# Patient Record
Sex: Female | Born: 1995 | Race: Black or African American | Hispanic: No | Marital: Single | Smoking: Current every day smoker
Health system: Southern US, Community
[De-identification: ages and names within clinical notes are randomized; demographics above are authoritative.]

## PROBLEM LIST (undated history)

## (undated) DIAGNOSIS — N946 Dysmenorrhea, unspecified: Secondary | ICD-10-CM

---
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
## 2015-01-08 ENCOUNTER — Emergency Department (HOSPITAL_COMMUNITY): Payer: Managed Care, Other (non HMO)

## 2015-01-08 ENCOUNTER — Encounter (HOSPITAL_COMMUNITY): Payer: Self-pay | Admitting: *Deleted

## 2015-01-08 ENCOUNTER — Emergency Department (HOSPITAL_COMMUNITY)
Admission: EM | Admit: 2015-01-08 | Discharge: 2015-01-08 | Disposition: A | Discharge status: Home / Self Care | Payer: Managed Care, Other (non HMO) | Attending: Emergency Medicine | Admitting: Emergency Medicine

## 2015-01-08 DIAGNOSIS — Z8742 Personal history of other diseases of the female genital tract: Secondary | ICD-10-CM | POA: Diagnosis not present

## 2015-01-08 DIAGNOSIS — R55 Syncope and collapse: Secondary | ICD-10-CM | POA: Diagnosis present

## 2015-01-08 DIAGNOSIS — Z3202 Encounter for pregnancy test, result negative: Secondary | ICD-10-CM | POA: Insufficient documentation

## 2015-01-08 LAB — CBC WITH DIFFERENTIAL/PLATELET
Basophils Absolute: 0 10*3/uL (ref 0.0–0.1)
Basophils Relative: 1 % (ref 0–1)
EOS ABS: 0.4 10*3/uL (ref 0.0–0.7)
EOS PCT: 7 % — AB (ref 0–5)
HCT: 39.7 % (ref 36.0–46.0)
Hemoglobin: 12.7 g/dL (ref 12.0–15.0)
LYMPHS PCT: 40 % (ref 12–46)
Lymphs Abs: 1.9 10*3/uL (ref 0.7–4.0)
MCH: 26.3 pg (ref 26.0–34.0)
MCHC: 32 g/dL (ref 30.0–36.0)
MCV: 82.4 fL (ref 78.0–100.0)
MONO ABS: 0.4 10*3/uL (ref 0.1–1.0)
Monocytes Relative: 9 % (ref 3–12)
Neutro Abs: 2.1 10*3/uL (ref 1.7–7.7)
Neutrophils Relative %: 43 % (ref 43–77)
PLATELETS: 260 10*3/uL (ref 150–400)
RBC: 4.82 MIL/uL (ref 3.87–5.11)
RDW: 13 % (ref 11.5–15.5)
WBC: 4.9 10*3/uL (ref 4.0–10.5)

## 2015-01-08 LAB — URINALYSIS, ROUTINE W REFLEX MICROSCOPIC
Bilirubin Urine: NEGATIVE
GLUCOSE, UA: NEGATIVE mg/dL
HGB URINE DIPSTICK: NEGATIVE
Ketones, ur: NEGATIVE mg/dL
LEUKOCYTES UA: NEGATIVE
Nitrite: NEGATIVE
PH: 6 (ref 5.0–8.0)
PROTEIN: 30 mg/dL — AB
SPECIFIC GRAVITY, URINE: 1.045 — AB (ref 1.005–1.030)
Urobilinogen, UA: 1 mg/dL (ref 0.0–1.0)

## 2015-01-08 LAB — URINE MICROSCOPIC-ADD ON

## 2015-01-08 LAB — COMPREHENSIVE METABOLIC PANEL
ALT: 11 U/L — ABNORMAL LOW (ref 14–54)
ANION GAP: 8 (ref 5–15)
AST: 19 U/L (ref 15–41)
Albumin: 4.1 g/dL (ref 3.5–5.0)
Alkaline Phosphatase: 48 U/L (ref 38–126)
BUN: 16 mg/dL (ref 6–20)
CHLORIDE: 108 mmol/L (ref 101–111)
CO2: 24 mmol/L (ref 22–32)
CREATININE: 1.06 mg/dL — AB (ref 0.44–1.00)
Calcium: 8.9 mg/dL (ref 8.9–10.3)
GFR calc Af Amer: >60 mL/min (ref >60)
Glucose, Bld: 88 mg/dL (ref 65–99)
POTASSIUM: 4.3 mmol/L (ref 3.5–5.1)
SODIUM: 140 mmol/L (ref 135–145)
Total Bilirubin: 0.3 mg/dL (ref 0.3–1.2)
Total Protein: 7.1 g/dL (ref 6.5–8.1)

## 2015-01-08 LAB — PREGNANCY, URINE: Preg Test, Ur: NEGATIVE

## 2015-01-08 MED ORDER — SODIUM CHLORIDE 0.9 % IV BOLUS (SEPSIS)
1000.0000 mL | Freq: Once | INTRAVENOUS | Status: AC
  Administered 2015-01-08: 1000 mL via INTRAVENOUS

## 2015-01-08 NOTE — Discharge Instructions (Signed)
Drink plenty of fluids.  Follow up with a family md if any more problems.  Eat something 3 times a day

## 2015-01-08 NOTE — ED Notes (Addendum)
Syncopal event. Noticed blurred vision just prior to passing out. Did strike right forehead, aching pain now. Mild headache. Denies SOB or CP. Vision back to normal.  Passed out Thursday while changing the oil in her car. Vomited a few times, then passed out. Similar episodes 2 years ago as well.

## 2015-01-08 NOTE — ED Provider Notes (Addendum)
CSN: [id number]     Arrival date & time 01/08/15  1515 History   First MD Initiated Contact with Patient 01/08/15 1520     Chief Complaint  Patient presents with  . Loss of Consciousness     (Consider location/radiation/quality/duration/timing/severity/associated sxs/prior Treatment) Patient is a 19 y.o. female presenting with syncope. The history is provided by the patient (The patient states that she was standing outside and felt dizzy and then passed out. Patient states that she had not eaten anything all day.).  Loss of Consciousness Episode history:  Single Most recent episode:  Today Timing:  Rare Chronicity:  New Context: not blood draw   Witnessed: no   Relieved by:  Drinking Ineffective treatments:  None tried Associated symptoms: no chest pain, no headaches and no seizures     Past Medical History  Diagnosis Date  . Menstrual cramps    History reviewed. No pertinent past surgical history. History reviewed. No pertinent family history. Social History  Substance Use Topics  . Smoking status: Never Smoker   . Smokeless tobacco: None  . Alcohol Use: No   OB History    No data available     Review of Systems  Constitutional: Negative for appetite change and fatigue.  HENT: Negative for congestion, ear discharge and sinus pressure.   Eyes: Negative for discharge.  Respiratory: Negative for cough.   Cardiovascular: Positive for syncope. Negative for chest pain.  Gastrointestinal: Negative for abdominal pain and diarrhea.  Genitourinary: Negative for frequency and hematuria.  Musculoskeletal: Negative for back pain.  Skin: Negative for rash.  Neurological: Positive for syncope. Negative for seizures and headaches.  Psychiatric/Behavioral: Negative for hallucinations.      Allergies  Pineapple  Home Medications   Prior to Admission medications   Medication Sig Start Date End Date Taking? 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 Patient not taking: Reported on 12/27/2014 11/09/14   Rolland Porter, MD  ibuprofen (ADVIL,MOTRIN) 600 MG tablet Take 1 tablet (600 mg total) by mouth every 6 (six) hours as needed. Patient not taking: Reported on 01/08/2015 12/27/14   Antony Madura, PA-C   BP 123/77 mmHg  Pulse 74  Temp(Src) 98.9 F (37.2 C) (Oral)  Resp 20  SpO2 100%  LMP 12/14/2014 Physical Exam  Constitutional: She is oriented to person, place, and time. She appears well-developed.  HENT:  Head: Normocephalic.  Eyes: Conjunctivae and EOM are normal. No scleral icterus.  Neck: Neck supple. No thyromegaly present.  Cardiovascular: Normal rate and regular rhythm.  Exam reveals no gallop and no friction rub.   No murmur heard. Pulmonary/Chest: No stridor. She has no wheezes. She has no rales. She exhibits no tenderness.  Abdominal: She exhibits no distension. There is no tenderness. There is no rebound.  Musculoskeletal: Normal range of motion. She exhibits no edema.  Lymphadenopathy:    She has no cervical adenopathy.  Neurological: She is oriented to person, place, and time. She exhibits normal muscle tone. Coordination normal.  Skin: No rash noted. No erythema.  Psychiatric: She has a normal mood and affect. Her behavior is normal.    ED Course  Procedures (including critical care time) Labs Review Labs Reviewed  CBC WITH DIFFERENTIAL/PLATELET - Abnormal; Notable for the following:    Eosinophils Relative 7 (*)    All other components within normal limits  COMPREHENSIVE METABOLIC PANEL - Abnormal; Notable  for the following:    Creatinine, Ser 1.06 (*)    ALT 11 (*)    All other components within normal limits  URINALYSIS, ROUTINE W REFLEX MICROSCOPIC (NOT AT [facility]) - Abnormal; Notable for the following:    APPearance CLOUDY (*)    Specific Gravity, Urine 1.045 (*)    Protein, ur 30 (*)    All other components  within normal limits  PREGNANCY, URINE  URINE MICROSCOPIC-ADD ON    Imaging Review Ct Head Wo Contrast  01/08/2015   CLINICAL DATA:  Blurred vision prior to passing out. Aching pain alum  EXAM: CT HEAD WITHOUT CONTRAST  TECHNIQUE: Contiguous axial images were obtained from the base of the skull through the vertex without intravenous contrast.  COMPARISON:  None.  FINDINGS: There is no evidence of mass effect, midline shift or extra-axial fluid collections. There is no evidence of a space-occupying lesion or intracranial hemorrhage. There is no evidence of a cortical-based area of acute infarction.  The ventricles and sulci are appropriate for the patient's age. The basal cisterns are patent.  Visualized portions of the orbits are unremarkable. The visualized portions of the paranasal sinuses and mastoid air cells are unremarkable.  The osseous structures are unremarkable.  IMPRESSION: Normal CT of the brain without intravenous contrast.   Electronically Signed   By: Elige Ko   On: 01/08/2015 16:12   I have personally reviewed and evaluated these images and lab results as part of my medical decision-making.   EKG Interpretation   Date/Time:  Sunday January 08 2015 15:21:36 EDT Ventricular Rate:  78 PR Interval:  151 QRS Duration: 71 QT Interval:  362 QTC Calculation: 412 R Axis:   90 Text Interpretation:  Sinus rhythm Borderline right axis deviation  Confirmed by Morgyn Marut  MD, Jentzen Minasyan ([id number]) on 01/08/2015 5:23:51 PM      MDM   Final diagnoses:  Syncope and collapse    Labs EKG CT of head follow unremarkable. Patient given 1 L of fluids. Syncope most likely secondary to not eating. Patient was instructed to eat at least something 3 times a day and follow-up with her doctor as needed.    Bethann Berkshire, MD 01/08/15 1725  Bethann Berkshire, MD 01/24/15 [phone]

## 2015-05-31 ENCOUNTER — Emergency Department (HOSPITAL_BASED_OUTPATIENT_CLINIC_OR_DEPARTMENT_OTHER)
Admission: EM | Admit: 2015-05-31 | Discharge: 2015-05-31 | Disposition: A | Discharge status: Home / Self Care | Payer: Managed Care, Other (non HMO) | Attending: Emergency Medicine | Admitting: Emergency Medicine

## 2015-05-31 ENCOUNTER — Encounter (HOSPITAL_BASED_OUTPATIENT_CLINIC_OR_DEPARTMENT_OTHER): Payer: Self-pay

## 2015-05-31 DIAGNOSIS — Z8742 Personal history of other diseases of the female genital tract: Secondary | ICD-10-CM | POA: Diagnosis not present

## 2015-05-31 DIAGNOSIS — K0889 Other specified disorders of teeth and supporting structures: Secondary | ICD-10-CM | POA: Diagnosis present

## 2015-05-31 MED ORDER — OXYCODONE-ACETAMINOPHEN 5-325 MG PO TABS: ORAL_TABLET | ORAL | Status: DC

## 2015-05-31 MED ORDER — AMOXICILLIN 500 MG PO CAPS
1000.0000 mg | ORAL_CAPSULE | Freq: Once | ORAL | Status: AC
  Administered 2015-05-31: 1000 mg via ORAL
  Filled 2015-05-31: qty 2

## 2015-05-31 MED ORDER — AMOXICILLIN 500 MG PO CAPS: 500.0000 mg | ORAL_CAPSULE | Freq: Three times a day (TID) | ORAL | Status: DC

## 2015-05-31 MED ORDER — OXYCODONE-ACETAMINOPHEN 5-325 MG PO TABS
1.0000 | ORAL_TABLET | Freq: Once | ORAL | Status: AC
  Administered 2015-05-31: 1 via ORAL
  Filled 2015-05-31: qty 1

## 2015-05-31 MED ORDER — ONDANSETRON 4 MG PO TBDP
4.0000 mg | ORAL_TABLET | Freq: Once | ORAL | Status: AC
  Administered 2015-05-31: 4 mg via ORAL
  Filled 2015-05-31: qty 1

## 2015-05-31 NOTE — ED Provider Notes (Signed)
CSN: [id number]     Arrival date & time 05/31/15  2047 History  By signing my name below, I, Tamara Cruz, attest that this documentation has been prepared under the direction and in the presence of [employer], PA-C. Electronically Signed: Linus Cruz, ED Scribe. 05/31/2015. 10:32 PM.   Chief Complaint  Patient presents with  . Dental Pain   HPI HPI Comments: Tamara Cruz is a 20 y.o. female with no PMHx who presents to the Emergency Department complaining of constant, unchanged top left dental pain that began last night. Pt reports pain is exacerbated with palpation but denies any alleviating factors. Pt took Tylenol with mild relief. Pt denies any fevers, chills, difficulty swallowing, or any other sx.   Past Medical History  Diagnosis Date  . Menstrual cramps    No past surgical history on file. No family history on file. Social History  Substance Use Topics  . Smoking status: Never Smoker   . Smokeless tobacco: None  . Alcohol Use: No   OB History    No data available     Review of Systems   A complete 10 system review of systems was obtained and all systems are negative except as noted in the HPI and PMH.   Allergies  Pineapple  Home Medications   Prior to Admission medications   Not on File   BP 137/86 mmHg  Pulse 82  Temp(Src) 98.3 F (36.8 C) (Oral)  Resp 18  Ht  (1.549 m)  Wt 118 lb (53.524 kg)  BMI 22.31 kg/m2  SpO2 100%  LMP 05/19/2015 Physical Exam  Constitutional: She is oriented to person, place, and time. She appears well-developed and well-nourished. No distress.  HENT:  Head: Normocephalic.  Mouth/Throat: Uvula is midline and oropharynx is clear and moist. No trismus in the jaw. No uvula swelling. No oropharyngeal exudate, posterior oropharyngeal edema, posterior oropharyngeal erythema or tonsillar abscesses.  Generally poor dentition, no gingival swelling, erythema or tenderness to palpation. Patient is handling their  secretions. There is no tenderness to palpation or firmness underneath tongue bilaterally. No trismus.    Eyes: Conjunctivae and EOM are normal.  Cardiovascular: Normal rate.   Pulmonary/Chest: Effort normal. No stridor.  Musculoskeletal: Normal range of motion.  Lymphadenopathy:    She has no cervical adenopathy.  Neurological: She is alert and oriented to person, place, and time.  Psychiatric: She has a normal mood and affect.  Nursing note and vitals reviewed.   ED Course  Procedures  DIAGNOSTIC STUDIES: Oxygen Saturation is 100% on room air, normal by my interpretation.    COORDINATION OF CARE: 10:43 PM WIll give Amoxicillin, Oxycodone and Zofran. Discussed treatment Cruz with pt at bedside and pt agreed to Cruz.   Labs Review Labs Reviewed - No data to display  Imaging Review No results found. I have personally reviewed and evaluated these images and lab results as part of my medical decision-making.   EKG Interpretation None      MDM   Final diagnoses:  Pain, dental    Filed Vitals:   05/31/15 2053  BP: 137/86  Pulse: 82  Temp: 98.3 F (36.8 C)  TempSrc: Oral  Resp: 18  Height:  (1.549 m)  Weight: 53.524 kg  SpO2: 100%    Medications  amoxicillin (AMOXIL) capsule 1,000 mg (not administered)  oxyCODONE-acetaminophen (PERCOCET/ROXICET) 5-325 MG per tablet 1 tablet (not administered)  ondansetron (ZOFRAN-ODT) disintegrating tablet 4 mg (not administered)    Tamara Cruz is 20 y.o.  female presenting with dental pain, slight swelling along the long, possible early dental abscess, no systemic signs of infection. Patient does have a outpatient dentist.    Evaluation does not show pathology that would require ongoing emergent intervention or inpatient treatment. Pt is hemodynamically stable and mentating appropriately. Discussed findings and Cruz with patient/guardian, who agrees with care Cruz. All questions answered. Return precautions discussed and  outpatient follow up given.   New Prescriptions   AMOXICILLIN (AMOXIL) 500 MG CAPSULE    Take 1 capsule (500 mg total) by mouth 3 (three) times daily.   OXYCODONE-ACETAMINOPHEN (PERCOCET/ROXICET) 5-325 MG TABLET    1 to 2 tabs PO q6hrs  PRN for pain     I personally performed the services described in this documentation, which was scribed in my presence. The recorded information has been reviewed and is accurate.     Tamara Emery, PA-C 05/31/15 2254  Tamara Plan, DO 06/01/15 1142

## 2015-05-31 NOTE — Discharge Instructions (Signed)
Take percocet for breakthrough pain, do not drink alcohol, drive, care for children or do other critical tasks while taking percocet.  Return to the emergency room for fever, change in vision, redness to the face that rapidly spreads towards the eye, nausea or vomiting, difficulty swallowing or shortness of breath.   Apply warm compresses to jaw throughout the day.   Take your antibiotics as directed and to the end of the course.   Followup with a dentist is very important for ongoing evaluation and management of recurrent dental pain. Return to emergency department for emergent changing or worsening symptoms.

## 2015-05-31 NOTE — ED Notes (Signed)
Left upper toothache since last night-tylenol,oragel with no relief

## 2015-07-18 ENCOUNTER — Emergency Department (HOSPITAL_BASED_OUTPATIENT_CLINIC_OR_DEPARTMENT_OTHER)
Admission: EM | Admit: 2015-07-18 | Discharge: 2015-07-18 | Disposition: A | Discharge status: Home / Self Care | Payer: Managed Care, Other (non HMO) | Attending: Emergency Medicine | Admitting: Emergency Medicine

## 2015-07-18 ENCOUNTER — Emergency Department (HOSPITAL_BASED_OUTPATIENT_CLINIC_OR_DEPARTMENT_OTHER): Payer: Managed Care, Other (non HMO)

## 2015-07-18 ENCOUNTER — Encounter (HOSPITAL_BASED_OUTPATIENT_CLINIC_OR_DEPARTMENT_OTHER): Payer: Self-pay | Admitting: *Deleted

## 2015-07-18 DIAGNOSIS — Z792 Long term (current) use of antibiotics: Secondary | ICD-10-CM | POA: Diagnosis not present

## 2015-07-18 DIAGNOSIS — J111 Influenza due to unidentified influenza virus with other respiratory manifestations: Secondary | ICD-10-CM | POA: Diagnosis not present

## 2015-07-18 DIAGNOSIS — J9801 Acute bronchospasm: Secondary | ICD-10-CM | POA: Insufficient documentation

## 2015-07-18 DIAGNOSIS — Z8742 Personal history of other diseases of the female genital tract: Secondary | ICD-10-CM | POA: Insufficient documentation

## 2015-07-18 DIAGNOSIS — R05 Cough: Secondary | ICD-10-CM | POA: Diagnosis present

## 2015-07-18 DIAGNOSIS — R000 Tachycardia, unspecified: Secondary | ICD-10-CM | POA: Diagnosis not present

## 2015-07-18 MED ORDER — ACETAMINOPHEN 500 MG PO TABS
1000.0000 mg | ORAL_TABLET | Freq: Once | ORAL | Status: AC
  Administered 2015-07-18: 1000 mg via ORAL
  Filled 2015-07-18: qty 2

## 2015-07-18 MED ORDER — ALBUTEROL SULFATE (2.5 MG/3ML) 0.083% IN NEBU
2.5000 mg | INHALATION_SOLUTION | Freq: Once | RESPIRATORY_TRACT | Status: AC
  Administered 2015-07-18: 2.5 mg via RESPIRATORY_TRACT
  Filled 2015-07-18: qty 3

## 2015-07-18 MED ORDER — GUAIFENESIN-CODEINE 100-10 MG/5ML PO SYRP: 5.0000 mL | ORAL_SOLUTION | Freq: Three times a day (TID) | ORAL | Status: DC | PRN

## 2015-07-18 MED ORDER — HYDROCODONE-ACETAMINOPHEN 7.5-325 MG/15ML PO SOLN
10.0000 mL | Freq: Once | ORAL | Status: AC
  Administered 2015-07-18: 10 mL via ORAL
  Filled 2015-07-18: qty 15

## 2015-07-18 MED ORDER — ALBUTEROL SULFATE HFA 108 (90 BASE) MCG/ACT IN AERS: 1.0000 | INHALATION_SPRAY | Freq: Four times a day (QID) | RESPIRATORY_TRACT | Status: DC | PRN

## 2015-07-18 MED ORDER — PREDNISONE 10 MG PO TABS
60.0000 mg | ORAL_TABLET | Freq: Once | ORAL | Status: AC
  Administered 2015-07-18: 60 mg via ORAL
  Filled 2015-07-18: qty 1

## 2015-07-18 MED ORDER — PREDNISONE 20 MG PO TABS: 20.0000 mg | ORAL_TABLET | Freq: Two times a day (BID) | ORAL | Status: DC

## 2015-07-18 NOTE — ED Notes (Signed)
Cough for a week. Fever last week.

## 2015-07-18 NOTE — ED Provider Notes (Signed)
CSN: [id number]     Arrival date & time 07/18/15  1701 History   First MD Initiated Contact with Patient 07/18/15 1719     Chief Complaint  Patient presents with  . Cough      HPI  Patient presents for evaluation of cough and fever. Cough for 2 days last week. Had fever for 2 days. Cough persists. States she coughs until she "almost gags or vomits". No nausea. Had diffuse bodyaches last week. Not now. No vomiting diarrhea.  Was not immunized for influenza.  Past Medical History  Diagnosis Date  . Menstrual cramps    History reviewed. No pertinent past surgical history. No family history on file. Social History  Substance Use Topics  . Smoking status: Never Smoker   . Smokeless tobacco: None  . Alcohol Use: No   OB History    No data available     Review of Systems  Constitutional: Positive for fever. Negative for chills, diaphoresis, appetite change and fatigue.  HENT: Negative for mouth sores, sore throat and trouble swallowing.   Eyes: Negative for visual disturbance.  Respiratory: Positive for cough, chest tightness, shortness of breath and wheezing.   Cardiovascular: Negative for chest pain.  Gastrointestinal: Negative for nausea, vomiting, abdominal pain, diarrhea and abdominal distention.  Endocrine: Negative for polydipsia, polyphagia and polyuria.  Genitourinary: Negative for dysuria, frequency and hematuria.  Musculoskeletal: Negative for gait problem.  Skin: Negative for color change, pallor and rash.  Neurological: Negative for dizziness, syncope, light-headedness and headaches.  Hematological: Does not bruise/bleed easily.  Psychiatric/Behavioral: Negative for behavioral problems and confusion.      Allergies  Pineapple  Home Medications   Prior to Admission medications   Medication Sig Start Date End Date Taking? Authorizing Provider  albuterol (PROVENTIL HFA;VENTOLIN HFA) 108 (90 Base) MCG/ACT inhaler Inhale 1-2 puffs into the lungs every 6 (six)  hours as needed for wheezing. 07/18/15   Rolland Porter, MD  amoxicillin (AMOXIL) 500 MG capsule Take 1 capsule (500 mg total) by mouth 3 (three) times daily. 05/31/15   Nicole Pisciotta, PA-C  guaiFENesin-codeine (CHERATUSSIN AC) 100-10 MG/5ML syrup Take 5 mLs by mouth 3 (three) times daily as needed for cough. 07/18/15   Rolland Porter, MD  oxyCODONE-acetaminophen (PERCOCET/ROXICET) 5-325 MG tablet 1 to 2 tabs PO q6hrs  PRN for pain 05/31/15   Joni Reining Pisciotta, PA-C  predniSONE (DELTASONE) 20 MG tablet Take 1 tablet (20 mg total) by mouth 2 (two) times daily with a meal. 07/18/15   Rolland Porter, MD   BP 107/66 mmHg  Temp(Src) 99.1 F (37.3 C) (Oral)  Resp 20  Ht 5' (1.524 m)  Wt 122 lb (55.339 kg)  BMI 23.83 kg/m2  SpO2 100%  LMP 07/11/2015 Physical Exam  Constitutional: She is oriented to person, place, and time. She appears well-developed and well-nourished. No distress.  HENT:  Head: Normocephalic.  Eyes: Conjunctivae are normal. Pupils are equal, round, and reactive to light. No scleral icterus.  Neck: Normal range of motion. Neck supple. No thyromegaly present.  Cardiovascular: Regular rhythm.  Tachycardia present.  Exam reveals no gallop and no friction rub.   No murmur heard. Pulmonary/Chest: Effort normal and breath sounds normal. No respiratory distress. She has no wheezes. She has no rales.  Wheezing prolongation in all fields. No focal signs of consolidation.  Abdominal: Soft. Bowel sounds are normal. She exhibits no distension. There is no tenderness. There is no rebound.  Musculoskeletal: Normal range of motion.  Neurological: She is alert and  oriented to person, place, and time.  Skin: Skin is warm and dry. No rash noted.  Psychiatric: She has a normal mood and affect. Her behavior is normal.    ED Course  Procedures (including critical care time) Labs Review Labs Reviewed - No data to display  Imaging Review Dg Chest 2 View  07/18/2015  CLINICAL DATA:  Acute onset of sore  throat, fever, cough and generalized chest tightness. Initial encounter. EXAM: CHEST  2 VIEW COMPARISON:  None. FINDINGS: The lungs are well-aerated and clear. There is no evidence of focal opacification, pleural effusion or pneumothorax. The heart is normal in size; the mediastinal contour is within normal limits. No acute osseous abnormalities are seen. Bilateral nipple piercings are noted. IMPRESSION: No acute cardiopulmonary process seen. Electronically Signed   By: Roanna RaiderJeffery  Chang M.D.   On: 07/18/2015 18:28   I have personally reviewed and evaluated these images and lab results as part of my medical decision-making.   EKG Interpretation None      MDM   Final diagnoses:  Influenza  Bronchospasm    My initial evaluation patient's heart rate 120. Temp 100.9. Given Tylenol. Chest x-ray obtained. Given nebulized albuterol and by mouth prednisone. On recheck her lungs are improved with mild prolongation and spasm with cough. No wheezing at rest. worker breathing. Heart rate was 18. Temperature improved 99.1. Plan is home, prednisone, Cheratussin, albuterol MDI. Probable influenza.   Rolland PorterMark Keyshawna Prouse, MD 07/18/15 1850

## 2015-07-18 NOTE — Discharge Instructions (Signed)

## 2015-10-11 ENCOUNTER — Encounter (HOSPITAL_BASED_OUTPATIENT_CLINIC_OR_DEPARTMENT_OTHER): Payer: Self-pay | Admitting: *Deleted

## 2015-10-11 DIAGNOSIS — N390 Urinary tract infection, site not specified: Secondary | ICD-10-CM | POA: Insufficient documentation

## 2015-10-11 DIAGNOSIS — M545 Low back pain: Secondary | ICD-10-CM | POA: Diagnosis present

## 2015-10-11 DIAGNOSIS — A5901 Trichomonal vulvovaginitis: Secondary | ICD-10-CM | POA: Diagnosis not present

## 2015-10-11 NOTE — ED Notes (Signed)
Pt c/o lower back pain x 8 hrs , denies urinary symptoms

## 2015-10-12 ENCOUNTER — Telehealth (HOSPITAL_BASED_OUTPATIENT_CLINIC_OR_DEPARTMENT_OTHER): Payer: Self-pay | Admitting: Emergency Medicine

## 2015-10-12 ENCOUNTER — Emergency Department (HOSPITAL_BASED_OUTPATIENT_CLINIC_OR_DEPARTMENT_OTHER)
Admission: EM | Admit: 2015-10-12 | Discharge: 2015-10-12 | Disposition: A | Discharge status: Home / Self Care | Payer: Managed Care, Other (non HMO) | Attending: Emergency Medicine | Admitting: Emergency Medicine

## 2015-10-12 DIAGNOSIS — A5901 Trichomonal vulvovaginitis: Secondary | ICD-10-CM

## 2015-10-12 DIAGNOSIS — N390 Urinary tract infection, site not specified: Secondary | ICD-10-CM

## 2015-10-12 LAB — URINALYSIS, ROUTINE W REFLEX MICROSCOPIC
BILIRUBIN URINE: NEGATIVE
GLUCOSE, UA: NEGATIVE mg/dL
KETONES UR: NEGATIVE mg/dL
Nitrite: POSITIVE — AB
PH: 7 (ref 5.0–8.0)
PROTEIN: NEGATIVE mg/dL
Specific Gravity, Urine: 1.021 (ref 1.005–1.030)

## 2015-10-12 LAB — WET PREP, GENITAL
Sperm: NONE SEEN
Yeast Wet Prep HPF POC: NONE SEEN

## 2015-10-12 LAB — URINE MICROSCOPIC-ADD ON

## 2015-10-12 LAB — GC/CHLAMYDIA PROBE AMP (~~LOC~~) NOT AT ARMC
CHLAMYDIA, DNA PROBE: NEGATIVE
NEISSERIA GONORRHEA: NEGATIVE

## 2015-10-12 LAB — PREGNANCY, URINE: Preg Test, Ur: NEGATIVE

## 2015-10-12 MED ORDER — METRONIDAZOLE 500 MG PO TABS
2000.0000 mg | ORAL_TABLET | Freq: Once | ORAL | Status: AC
  Administered 2015-10-12: 2000 mg via ORAL
  Filled 2015-10-12: qty 4

## 2015-10-12 MED ORDER — NAPROXEN 250 MG PO TABS
500.0000 mg | ORAL_TABLET | Freq: Once | ORAL | Status: AC
  Administered 2015-10-12: 500 mg via ORAL
  Filled 2015-10-12: qty 2

## 2015-10-12 MED ORDER — HYDROCODONE-ACETAMINOPHEN 5-325 MG PO TABS
1.0000 | ORAL_TABLET | Freq: Once | ORAL | Status: AC
  Administered 2015-10-12: 1 via ORAL
  Filled 2015-10-12: qty 1

## 2015-10-12 MED ORDER — HYDROCODONE-ACETAMINOPHEN 5-325 MG PO TABS: 2.0000 | ORAL_TABLET | Freq: Four times a day (QID) | ORAL | Status: DC | PRN

## 2015-10-12 MED ORDER — SULFAMETHOXAZOLE-TRIMETHOPRIM 800-160 MG PO TABS: 1.0000 | ORAL_TABLET | Freq: Two times a day (BID) | ORAL | Status: AC

## 2015-10-12 MED ORDER — SULFAMETHOXAZOLE-TRIMETHOPRIM 800-160 MG PO TABS
1.0000 | ORAL_TABLET | Freq: Once | ORAL | Status: AC
  Administered 2015-10-12: 1 via ORAL
  Filled 2015-10-12: qty 1

## 2015-10-12 NOTE — ED Notes (Signed)
Pt c/o middle lower back pain that started this afternoon.  She denies known injury, denies urinary symptoms, denies numbness in groin, denies incontinence, pain worse with movement.  Pt took 1000mg  tylenol without relief.

## 2015-10-12 NOTE — ED Notes (Signed)
Pt verbalizes understanding of d/c instructions and denies any further needs at this time. 

## 2015-10-12 NOTE — ED Notes (Signed)
Pt c/o pain, offered her some Ibuprofen and mom stated, "I can just giver her that if that's all you're going to give her."  Dr. Read DriversMolpus informed as to pt's request for something stronger.

## 2015-10-12 NOTE — ED Notes (Signed)
MD at bedside. 

## 2015-10-12 NOTE — ED Provider Notes (Signed)
CSN: [id number]     Arrival date & time 10/11/15  2351 History   First MD Initiated Contact with Patient 10/12/15 0127     Chief Complaint  Patient presents with  . Back Pain     (Consider location/radiation/quality/duration/timing/severity/associated sxs/prior Treatment) HPI  This is a 20 year old female with mid lower back pain that began yesterday afternoon. She denies any injury. Pain is moderate and worse with movement. She has taken 1000 milligrams of Tylenol without relief. She denies fever, chills, nausea, vomiting, diarrhea, dysuria, hematuria, vaginal bleeding or vaginal discharge.  Past Medical History  Diagnosis Date  . Menstrual cramps    History reviewed. No pertinent past surgical history. History reviewed. No pertinent family history. Social History  Substance Use Topics  . Smoking status: Never Smoker   . Smokeless tobacco: None  . Alcohol Use: No   OB History    No data available     Review of Systems  All other systems reviewed and are negative.   Allergies  Pineapple  Home Medications   Prior to Admission medications   Not on File   BP 92/59 mmHg  Pulse 85  Temp(Src) 98.2 F (36.8 C) (Oral)  Resp 18  Ht  (1.549 m)  Wt 120 lb (54.432 kg)  BMI 22.69 kg/m2  SpO2 97%  LMP 10/09/2015   Physical Exam  General: Well-developed, well-nourished female in no acute distress; appearance consistent with age of record HENT: normocephalic; atraumatic Eyes: pupils equal, round and reactive to light; extraocular muscles intact Neck: supple Heart: regular rate and rhythm Lungs: clear to auscultation bilaterally Abdomen: soft; nondistended; nontender; no masses or hepatosplenomegaly; bowel sounds present' GU: Bilateral CVA tenderness; normal external genitalia; white vaginal discharge; no cervical motion tenderness; no adnexal tenderness Extremities: No deformity; full range of motion; pulses normal Neurologic: Awake, alert and oriented; motor  function intact in all extremities and symmetric; no facial droop Skin: Warm and dry Psychiatric: Normal mood and affect    ED Course  Procedures (including critical care time)   MDM   Nursing notes and vitals signs, including pulse oximetry, reviewed.  Summary of this visit's results, reviewed by myself:  Labs:  Results for orders placed or performed during the hospital encounter of 10/12/15 (from the past 24 hour(s))  Urinalysis, Routine w reflex microscopic (not at [facility])     Status: Abnormal   Collection Time: 10/12/15  1:04 AM  Result Value Ref Range   Color, Urine YELLOW YELLOW   APPearance CLOUDY (A) CLEAR   Specific Gravity, Urine 1.021 1.005 - 1.030   pH 7.0 5.0 - 8.0   Glucose, UA NEGATIVE NEGATIVE mg/dL   Hgb urine dipstick SMALL (A) NEGATIVE   Bilirubin Urine NEGATIVE NEGATIVE   Ketones, ur NEGATIVE NEGATIVE mg/dL   Protein, ur NEGATIVE NEGATIVE mg/dL   Nitrite POSITIVE (A) NEGATIVE   Leukocytes, UA LARGE (A) NEGATIVE  Pregnancy, urine     Status: None   Collection Time: 10/12/15  1:04 AM  Result Value Ref Range   Preg Test, Ur NEGATIVE NEGATIVE  Urine microscopic-add on     Status: Abnormal   Collection Time: 10/12/15  1:04 AM  Result Value Ref Range   Squamous Epithelial / LPF 6-30 (A) NONE SEEN   WBC, UA 6-30 0 - 5 WBC/hpf   RBC / HPF 0-5 0 - 5 RBC/hpf   Bacteria, UA MANY (A) NONE SEEN   Urine-Other TRICHOMONAS PRESENT   Wet prep, genital     Status:  Abnormal   Collection Time: 10/12/15  1:37 AM  Result Value Ref Range   Yeast Wet Prep HPF POC NONE SEEN NONE SEEN   Trich, Wet Prep PRESENT (A) NONE SEEN   Clue Cells Wet Prep HPF POC PRESENT (A) NONE SEEN   WBC, Wet Prep HPF POC MODERATE (A) NONE SEEN   Sperm NONE SEEN    Will treat for possible early pyelonephritis. Will treat for trichomoniasis. GC/chlamydia pending.    Paula LibraJohn Ulysess Witz, MD 10/12/15 [phone]

## 2015-10-14 LAB — URINE CULTURE: Culture: >=100000 — AB

## 2015-10-15 ENCOUNTER — Telehealth (HOSPITAL_BASED_OUTPATIENT_CLINIC_OR_DEPARTMENT_OTHER): Payer: Self-pay

## 2015-10-15 NOTE — Telephone Encounter (Signed)
Post ED Visit - Positive Culture Follow-up  Culture report reviewed by antimicrobial stewardship pharmacist:  []  Enzo BiNathan Batchelder, Pharm.D. []  Celedonio MiyamotoJeremy Frens, [address]Ph[address] BCPS []  Garvin FilaMike Maccia, Pharm.D. []  Georgina PillionElizabeth Martin, Pharm.D., BCPS []  BeverlyMinh Pham, [address]Ph[address] BCPS, AAHIVP []  Estella HuskMichelle Turner, Pharm.D., BCPS, AAHIVP [x]  Tennis Mustassie Stewart, Pharm.D. []  Sherle Poeob Vincent, [address]Ph[address]  Positive urine culture Treated with Sulfamethoxazole, organism sensitive to the same and no further patient follow-up is required at this time.  Jerry CarasCullom, Omero Kowal Burnett 10/15/2015, 11:25 AM

## 2015-12-09 ENCOUNTER — Emergency Department (HOSPITAL_BASED_OUTPATIENT_CLINIC_OR_DEPARTMENT_OTHER)
Admission: EM | Admit: 2015-12-09 | Discharge: 2015-12-09 | Disposition: A | Discharge status: Home / Self Care | Payer: Managed Care, Other (non HMO) | Attending: Dermatology | Admitting: Dermatology

## 2015-12-09 ENCOUNTER — Encounter (HOSPITAL_BASED_OUTPATIENT_CLINIC_OR_DEPARTMENT_OTHER): Payer: Self-pay | Admitting: *Deleted

## 2015-12-09 DIAGNOSIS — Z5321 Procedure and treatment not carried out due to patient leaving prior to being seen by health care provider: Secondary | ICD-10-CM | POA: Insufficient documentation

## 2015-12-09 DIAGNOSIS — R1110 Vomiting, unspecified: Secondary | ICD-10-CM | POA: Diagnosis present

## 2015-12-09 NOTE — ED Notes (Signed)
Pt called x 2 in lobby, no answer 

## 2015-12-09 NOTE — ED Notes (Signed)
Pt called a 3rd time in lobby, no answer.  Pt not found in lobby, bathroom or surrounding area.

## 2015-12-09 NOTE — ED Triage Notes (Signed)
Pt reports she has been vomiting for 4 days since she started taking antibiotics for a UTI. Pt says that there was some blood (bright red) in her emesis today and her body became weak. She says that she feels better now.

## 2015-12-14 ENCOUNTER — Emergency Department (HOSPITAL_BASED_OUTPATIENT_CLINIC_OR_DEPARTMENT_OTHER)
Admission: EM | Admit: 2015-12-14 | Discharge: 2015-12-14 | Disposition: A | Discharge status: Home / Self Care | Payer: Managed Care, Other (non HMO) | Attending: Emergency Medicine | Admitting: Emergency Medicine

## 2015-12-14 ENCOUNTER — Encounter (HOSPITAL_BASED_OUTPATIENT_CLINIC_OR_DEPARTMENT_OTHER): Payer: Self-pay | Admitting: *Deleted

## 2015-12-14 DIAGNOSIS — O219 Vomiting of pregnancy, unspecified: Secondary | ICD-10-CM | POA: Insufficient documentation

## 2015-12-14 DIAGNOSIS — O26891 Other specified pregnancy related conditions, first trimester: Secondary | ICD-10-CM | POA: Insufficient documentation

## 2015-12-14 DIAGNOSIS — Z3A08 8 weeks gestation of pregnancy: Secondary | ICD-10-CM | POA: Diagnosis not present

## 2015-12-14 DIAGNOSIS — Z3490 Encounter for supervision of normal pregnancy, unspecified, unspecified trimester: Secondary | ICD-10-CM

## 2015-12-14 LAB — URINE MICROSCOPIC-ADD ON: RBC / HPF: NONE SEEN RBC/hpf (ref 0–5)

## 2015-12-14 LAB — URINALYSIS, ROUTINE W REFLEX MICROSCOPIC
BILIRUBIN URINE: NEGATIVE
Glucose, UA: NEGATIVE mg/dL
HGB URINE DIPSTICK: NEGATIVE
Ketones, ur: >80 mg/dL — AB
Leukocytes, UA: NEGATIVE
Nitrite: NEGATIVE
PROTEIN: 30 mg/dL — AB
Specific Gravity, Urine: 1.031 — ABNORMAL HIGH (ref 1.005–1.030)
pH: 6.5 (ref 5.0–8.0)

## 2015-12-14 LAB — PREGNANCY, URINE: PREG TEST UR: POSITIVE — AB

## 2015-12-14 MED ORDER — SODIUM CHLORIDE 0.9 % IV BOLUS (SEPSIS): 500.0000 mL | Freq: Once | INTRAVENOUS | Status: DC

## 2015-12-14 MED ORDER — SODIUM CHLORIDE 0.9 % IV BOLUS (SEPSIS)
1000.0000 mL | Freq: Once | INTRAVENOUS | Status: AC
  Administered 2015-12-14: 1000 mL via INTRAVENOUS

## 2015-12-14 MED ORDER — ONDANSETRON HCL 4 MG/2ML IJ SOLN
4.0000 mg | Freq: Once | INTRAMUSCULAR | Status: AC
  Administered 2015-12-14: 4 mg via INTRAVENOUS
  Filled 2015-12-14: qty 2

## 2015-12-14 MED ORDER — ONDANSETRON HCL 4 MG PO TABS: 4.0000 mg | ORAL_TABLET | Freq: Three times a day (TID) | ORAL | 0 refills | Status: DC | PRN

## 2015-12-14 NOTE — ED Provider Notes (Signed)
[facility] Note   CSN: [id number] Arrival date & time: 12/14/15  1721  First Provider Contact:  First MD Initiated Contact with Patient 12/14/15 1808        History   Chief Complaint Chief Complaint  Patient presents with  . Abdominal Pain    HPI Tamara Cruz is a 20 y.o. female.  Patient is a healthy 20 yo F who presents today with complaints of nausea and vomiting. She was last sexually active 2 months ago and reports that her last menstrual cycle was one week ago. She was seen by her gynecologist 2 weeks ago for vaginal discharge and was prescribed flagyl for bacterial vaginosis. Urine pregnancy test was negative at that time. Since then she has experienced worsening nausea with multiple episodes of non bloody non bilious emesis a day. She thought that the flagyl was causing her symptoms and stopped taking the medicine, however her symptoms have continued. Urine pregnancy test today is positive.       Past Medical History:  Diagnosis Date  . Menstrual cramps     There are no active problems to display for this patient.   History reviewed. No pertinent surgical history.  OB History    No data available       Home Medications    Prior to Admission medications   Medication Sig Start Date End Date Taking? Authorizing Provider  HYDROcodone-acetaminophen (NORCO) 5-325 MG tablet Take 2 tablets by mouth every 6 (six) hours as needed (for pain). 10/12/15   Paula Libra, MD    Family History No family history on file.  Social History Social History  Substance Use Topics  . Smoking status: Never Smoker  . Smokeless tobacco: Never Used  . Alcohol use No     Allergies   Pineapple   Review of Systems Review of Systems  Constitutional: Negative.   HENT: Negative.   Eyes: Negative.   Respiratory: Negative.   Cardiovascular: Negative.   Gastrointestinal: Positive for nausea and vomiting.  Endocrine: Negative.   Genitourinary: Negative.    Musculoskeletal: Negative.   Allergic/Immunologic: Negative.   Neurological: Negative.   Hematological: Negative.   Psychiatric/Behavioral: Negative.      Physical Exam Updated Vital Signs BP 113/64   Pulse 64   Temp 98.1 F (36.7 C) (Oral)   Resp 18   Ht  (1.575 m)   Wt 53.1 kg   LMP 11/11/2015   SpO2 100%   BMI 21.40 kg/m   Physical Exam  Constitutional: She is oriented to person, place, and time. She appears well-developed and well-nourished. No distress.  HENT:  Head: Normocephalic and atraumatic.  Eyes: Conjunctivae are normal.  Neck: Neck supple.  Cardiovascular: Normal rate and regular rhythm.   No murmur heard. Pulmonary/Chest: Effort normal and breath sounds normal. No respiratory distress.  Abdominal: Soft. There is no tenderness.  Musculoskeletal: She exhibits no edema.  Neurological: She is alert and oriented to person, place, and time.  Skin: Skin is warm and dry.  Psychiatric: She has a normal mood and affect.  Nursing note and vitals reviewed.    ED Treatments / Results  Labs (all labs ordered are listed, but only abnormal results are displayed) Labs Reviewed  URINALYSIS, ROUTINE W REFLEX MICROSCOPIC (NOT AT [facility]) - Abnormal; Notable for the following:       Result Value   Color, Urine AMBER (*)    APPearance CLOUDY (*)    Specific Gravity, Urine 1.031 (*)  Ketones, ur >80 (*)    Protein, ur 30 (*)    All other components within normal limits  PREGNANCY, URINE - Abnormal; Notable for the following:    Preg Test, Ur POSITIVE (*)    All other components within normal limits  URINE MICROSCOPIC-ADD ON - Abnormal; Notable for the following:    Squamous Epithelial / LPF 6-30 (*)    Bacteria, UA MANY (*)    All other components within normal limits    EKG  EKG Interpretation None       Radiology No results found.  Procedures Procedures (including critical care time)  Medications Ordered in ED Medications  ondansetron  (ZOFRAN) injection 4 mg (4 mg Intravenous Given 12/14/15 1820)  sodium chloride 0.9 % bolus 1,000 mL (1,000 mLs Intravenous New Bag/Given 12/14/15 1820)     Initial Impression / Assessment and Plan / ED Course  I have reviewed the triage vital signs and the nursing notes.  Pertinent labs & imaging results that were available during my care of the patient were reviewed by me and considered in my medical decision making (see chart for details).  Clinical Course   Nausea/vomitting: Pregnancy test positive today. Patient is upset with the news as this pregnancy was unplanned. She is currently in school and still processing the information. Patient's mother was present and supportive today. Encouraged patient to follow up with her OBGYN for further care. She was given 1 L NS bolus and 4mg  IV Zofran. Sent home with prescription for Zofran prn.   Final Clinical Impressions(s) / ED Diagnoses   Final diagnoses:  None    New Prescriptions New Prescriptions   No medications on file     Reymundo Pollarolyn Sheran Newstrom, MD 12/14/15 1900    Nelva Nayobert Beaton, MD 12/15/15 [phone]

## 2015-12-14 NOTE — ED Triage Notes (Signed)
Vomiting x 5 days. Her abdomen is sore from vomiting.

## 2017-12-19 ENCOUNTER — Other Ambulatory Visit: Payer: Self-pay

## 2017-12-19 ENCOUNTER — Emergency Department (HOSPITAL_BASED_OUTPATIENT_CLINIC_OR_DEPARTMENT_OTHER): Payer: Managed Care, Other (non HMO)

## 2017-12-19 ENCOUNTER — Encounter (HOSPITAL_BASED_OUTPATIENT_CLINIC_OR_DEPARTMENT_OTHER): Payer: Self-pay

## 2017-12-19 ENCOUNTER — Emergency Department (HOSPITAL_BASED_OUTPATIENT_CLINIC_OR_DEPARTMENT_OTHER)
Admission: EM | Admit: 2017-12-19 | Discharge: 2017-12-19 | Disposition: A | Discharge status: Home / Self Care | Payer: Managed Care, Other (non HMO) | Attending: Emergency Medicine | Admitting: Emergency Medicine

## 2017-12-19 DIAGNOSIS — Y999 Unspecified external cause status: Secondary | ICD-10-CM | POA: Diagnosis not present

## 2017-12-19 DIAGNOSIS — Y939 Activity, unspecified: Secondary | ICD-10-CM | POA: Diagnosis not present

## 2017-12-19 DIAGNOSIS — W269XXA Contact with unspecified sharp object(s), initial encounter: Secondary | ICD-10-CM | POA: Insufficient documentation

## 2017-12-19 DIAGNOSIS — S91312A Laceration without foreign body, left foot, initial encounter: Secondary | ICD-10-CM | POA: Diagnosis not present

## 2017-12-19 DIAGNOSIS — Y92009 Unspecified place in unspecified non-institutional (private) residence as the place of occurrence of the external cause: Secondary | ICD-10-CM | POA: Diagnosis not present

## 2017-12-19 DIAGNOSIS — S99922A Unspecified injury of left foot, initial encounter: Secondary | ICD-10-CM | POA: Diagnosis present

## 2017-12-19 MED ORDER — IBUPROFEN 400 MG PO TABS
600.0000 mg | ORAL_TABLET | Freq: Once | ORAL | Status: AC
  Administered 2017-12-19: 600 mg via ORAL
  Filled 2017-12-19: qty 1

## 2017-12-19 NOTE — ED Provider Notes (Signed)
[facility] EMERGENCY DEPARTMENT Provider Note   CSN: [id number] Arrival date & time: 12/19/17  1914     History   Chief Complaint Chief Complaint  Patient presents with  . Extremity Laceration    HPI Tamara Cruz is a 22 y.o. female.  Tamara CargoLadeja Jacobsen is a 22 y.o. Female who is otherwise healthy, presents to the emergency department for evaluation of injury to her left foot.  At about 645 this evening she dropped a mirror on the foot in the corner hit just at the base of her fourth toe.  Patient reports 2 small lacerations to the toe top of the foot, bleeding is controlled.  No numbness or tingling.  Pain is a constant dull ache that is worse with movement.  She has not taken anything for pain prior to arrival.  Tetanus has been updated within the last 5 years.  No other injuries from the mirror.  No prior injury or surgery to the foot.     Past Medical History:  Diagnosis Date  . Menstrual cramps     There are no active problems to display for this patient.   History reviewed. No pertinent surgical history.   OB History   None      Home Medications    Prior to Admission medications   Medication Sig Start Date End Date Taking? Authorizing Provider  ondansetron (ZOFRAN) 4 MG tablet Take 1 tablet (4 mg total) by mouth every 8 (eight) hours as needed for nausea or vomiting. 12/14/15   Reymundo PollGuilloud, Carolyn, MD    Family History No family history on file.  Social History Social History   Tobacco Use  . Smoking status: Never Smoker  . Smokeless tobacco: Never Used  Substance Use Topics  . Alcohol use: No  . Drug use: No     Allergies   Pineapple   Review of Systems Review of Systems  Constitutional: Negative for chills and fever.  Musculoskeletal: Positive for arthralgias and joint swelling.  Skin: Positive for wound. Negative for color change.  Neurological: Negative for weakness and numbness.     Physical Exam Updated Vital Signs BP 109/73  (BP Location: Left Arm)   Pulse 77   Temp 98.3 F (36.8 C) (Oral)   Resp 18   Ht 5\' 1"  (1.549 m)   Wt 45.3 kg   SpO2 100%   BMI 18.87 kg/m   Physical Exam  Constitutional: She appears well-developed and well-nourished. No distress.  HENT:  Head: Normocephalic and atraumatic.  Eyes: Right eye exhibits no discharge. Left eye exhibits no discharge.  Pulmonary/Chest: Effort normal. No respiratory distress.  Musculoskeletal:  Tenderness to palpation at the base of the fourth and fifth MTP joint, mild swelling, no ecchymosis, no erythema or warmth.  There are 2 very small,<1 cm lacerations on the top of the foot at the base of the fourth MTP, they are extremely superficial and do not warrant suture repair.  No active bleeding.  2+ DP and TP pulses, sensation intact, patient can wiggle all toes  Neurological: She is alert. Coordination normal.  Skin: Skin is warm and dry. Capillary refill takes less than 2 seconds. She is not diaphoretic.  Psychiatric: She has a normal mood and affect. Her behavior is normal.  Nursing note and vitals reviewed.      ED Treatments / Results  Labs (all labs ordered are listed, but only abnormal results are displayed) Labs Reviewed - No data to display  EKG None  Radiology  Dg Foot Complete Left  Result Date: 12/19/2017 CLINICAL DATA:  Patient dropped heavy object on foot EXAM: LEFT FOOT - COMPLETE 3+ VIEW COMPARISON:  None. FINDINGS: Frontal, oblique, and lateral views were obtained. No evident fracture or dislocation. Joint spaces appear normal. No erosive change. IMPRESSION: No fracture or dislocation.  No apparent arthropathy. Electronically Signed   By: Bretta BangWilliam  Woodruff III M.D.   On: 12/19/2017 21:26    Procedures Procedures (including critical care time)  Medications Ordered in ED Medications  ibuprofen (ADVIL,MOTRIN) tablet 600 mg (600 mg Oral Given 12/19/17 2053)     Initial Impression / Assessment and Plan / ED Course  I have  reviewed the triage vital signs and the nursing notes.  Pertinent labs & imaging results that were available during my care of the patient were reviewed by me and considered in my medical decision making (see chart for details).  Patient presents for evaluation of injury to left foot after she dropped a mirror on it, 2 very small superficial lacerations to the fourth MTP joint, do not feel that these will require sutured repair, dressed with bacitracin ointment and bandage.  Foot is neurovascularly intact.  X-ray shows no associated fracture or dislocation.  Likely foot contusion.  Encouraged ice and elevation, ibuprofen and Tylenol as needed for pain.  Stable for discharge at this time.  Patient expresses understanding and is in agreement with plan  Final Clinical Impressions(s) / ED Diagnoses   Final diagnoses:  Injury of left foot, initial encounter  Laceration of left foot, initial encounter    ED Discharge Orders    None       Legrand RamsFord, Jaceion Aday N, PA-C 12/20/17 0145    Shaune PollackIsaacs, Cameron, MD 12/21/17 [phone]

## 2017-12-19 NOTE — ED Triage Notes (Signed)
Pt states she dropped mirror on left foot approx 645-lac x 2 to left foot-cleaned/dsg applied in traige-NAD-steady gait

## 2017-12-19 NOTE — Discharge Instructions (Signed)
Your x-ray shows no evidence of fracture.  The 2 small cuts on your foot should heal well with antibiotic ointment and bandage.  These areas clean and covered, monitor for any surrounding redness, swelling, drainage or increasing pain as these are signs of infection.  If these things occur please follow-up with your primary care doctor return to the emergency department.

## 2018-02-03 ENCOUNTER — Encounter (HOSPITAL_BASED_OUTPATIENT_CLINIC_OR_DEPARTMENT_OTHER): Payer: Self-pay | Admitting: *Deleted

## 2018-02-03 ENCOUNTER — Emergency Department (HOSPITAL_BASED_OUTPATIENT_CLINIC_OR_DEPARTMENT_OTHER)
Admission: EM | Admit: 2018-02-03 | Discharge: 2018-02-03 | Disposition: A | Discharge status: Home / Self Care | Payer: Managed Care, Other (non HMO) | Attending: Emergency Medicine | Admitting: Emergency Medicine

## 2018-02-03 ENCOUNTER — Other Ambulatory Visit: Payer: Self-pay

## 2018-02-03 DIAGNOSIS — R112 Nausea with vomiting, unspecified: Secondary | ICD-10-CM | POA: Insufficient documentation

## 2018-02-03 DIAGNOSIS — R197 Diarrhea, unspecified: Secondary | ICD-10-CM | POA: Diagnosis not present

## 2018-02-03 NOTE — ED Notes (Signed)
Pt/family verbalized understanding of discharge instructions.   

## 2018-02-03 NOTE — ED Triage Notes (Signed)
Pt c/o n/v/d x 5 days, better today but needs work note

## 2018-02-03 NOTE — Discharge Instructions (Signed)
Your history and exam today suggested a gastroenteritis contributing to your nausea, vomiting, diarrhea over the weekend.  As your emesis has resolved and had reassuring vital signs and exam, we feel you are safe to return back to work tomorrow.  Please observe good handwashing.  Please stay hydrated.  Please follow-up with your primary doctor.  If any symptoms change or worsen, please return to the nearest emergency department.

## 2018-02-03 NOTE — ED Provider Notes (Signed)
[facility] EMERGENCY DEPARTMENT Provider Note   CSN: [id number] Arrival date & time: 02/03/18  1550     History   Chief Complaint No chief complaint on file.   HPI Tamara Cruz is a 22 y.o. female.  The history is provided by the patient. No language interpreter was used.  Emesis   This is a new problem. The current episode started more than 2 days ago. The problem occurs continuously. The problem has been resolved. The emesis has an appearance of stomach contents. There has been no fever. Associated symptoms include diarrhea. Pertinent negatives include no abdominal pain, no chills, no cough, no fever, no headaches and no myalgias. Risk factors include ill contacts.    Past Medical History:  Diagnosis Date  . Menstrual cramps     There are no active problems to display for this patient.   History reviewed. No pertinent surgical history.   OB History   None      Home Medications    Prior to Admission medications   Medication Sig Start Date End Date Taking? Authorizing Provider  ondansetron (ZOFRAN) 4 MG tablet Take 1 tablet (4 mg total) by mouth every 8 (eight) hours as needed for nausea or vomiting. 12/14/15   Reymundo Poll, MD    Family History History reviewed. No pertinent family history.  Social History Social History   Tobacco Use  . Smoking status: Never Smoker  . Smokeless tobacco: Never Used  Substance Use Topics  . Alcohol use: No  . Drug use: No     Allergies   Pineapple   Review of Systems Review of Systems  Constitutional: Negative for chills, diaphoresis, fatigue and fever.  HENT: Negative for dental problem.   Respiratory: Negative for cough, chest tightness, shortness of breath, wheezing and stridor.   Cardiovascular: Negative for chest pain and palpitations.  Gastrointestinal: Positive for diarrhea, nausea and vomiting. Negative for abdominal pain and constipation.  Genitourinary: Negative for decreased urine  volume, dysuria, vaginal bleeding, vaginal discharge and vaginal pain.  Musculoskeletal: Negative for back pain, myalgias, neck pain and neck stiffness.  Neurological: Negative for light-headedness and headaches.  Psychiatric/Behavioral: Negative for agitation.  All other systems reviewed and are negative.    Physical Exam Updated Vital Signs BP 112/70 (BP Location: Left Arm)   Pulse 71   Temp 98.5 F (36.9 C) (Oral)   Resp 18   Ht 5\' 1"  (1.549 m)   Wt 56.7 kg   SpO2 100%   BMI 23.62 kg/m   Physical Exam  Constitutional: She is oriented to person, place, and time. She appears well-developed and well-nourished. No distress.  HENT:  Head: Normocephalic and atraumatic.  Nose: Nose normal.  Mouth/Throat: Oropharynx is clear and moist. No oropharyngeal exudate.  Eyes: Pupils are equal, round, and reactive to light. Conjunctivae and EOM are normal.  Neck: Neck supple.  Cardiovascular: Normal rate, regular rhythm and intact distal pulses.  No murmur heard. Pulmonary/Chest: Effort normal and breath sounds normal. No respiratory distress. She has no wheezes. She has no rales. She exhibits no tenderness.  Abdominal: Soft. There is no tenderness. There is no guarding.  Musculoskeletal: She exhibits no edema or tenderness.  Neurological: She is alert and oriented to person, place, and time. No sensory deficit. She exhibits normal muscle tone.  Skin: Skin is warm and dry. No rash noted. She is not diaphoretic. No erythema.  Psychiatric: She has a normal mood and affect.  Nursing note and vitals reviewed.  ED Treatments / Results  Labs (all labs ordered are listed, but only abnormal results are displayed) Labs Reviewed - No data to display  EKG None  Radiology No results found.  Procedures Procedures (including critical care time)  Medications Ordered in ED Medications - No data to display   Initial Impression / Assessment and Plan / ED Course  I have reviewed the  triage vital signs and the nursing notes.  Pertinent labs & imaging results that were available during my care of the patient were reviewed by me and considered in my medical decision making (see chart for details).     Tamara Cruz is a 22 y.o. female with no significant past medical history who presents with 5 days of nausea vomiting, and diarrhea.  She reports her symptoms are all improving.  She reports she had a minimal loose bowel movement this morning but has had no nausea or vomiting today.  She reports she is now maintaining hydration and eating and drinking normally.  She denies any fevers, chills, abdominal pain.  She denies any chest pain, shortness breath, or cough.  She reports her daughter had GI symptoms last week and suspects it was a viral infection she got from her.  She reports that she works in [employer] and was unable to go to work yesterday or today.  Since she is feeling better, she requested evaluation to go back to work tomorrow.  Vital signs reassuring on arrival.  On exam, abdomen was nontender.  She had no nausea vomiting pain lungs were clear.  She appears extremely well.  Normal gait.  Patient is resting comfortably in no distress.  Given patient's well appearance and clearly improving symptoms, we did not feel the need to obtain blood work or imaging at this time.  Given patient's report that she is able to tolerate p.o. and symptoms have resolved, we feel she is safe for discharge home.  Patient will follow-up with her PCP.  Patient given work note.  Patient discharged in good condition with resolved symptoms.       Final Clinical Impressions(s) / ED Diagnoses   Final diagnoses:  Nausea vomiting and diarrhea    ED Discharge Orders    None      Clinical Impression: 1. Nausea vomiting and diarrhea     Disposition: Discharge  Condition: Good  I have discussed the results, Dx and Tx plan with the pt(& family if present). He/she/they expressed  understanding and agree(s) with the plan. Discharge instructions discussed at great length. Strict return precautions discussed and pt &/or family have verbalized understanding of the instructions. No further questions at time of discharge.    New Prescriptions   No medications on file    Follow Up: [facility] [address] [id number] [address]c [place] [address] [phone]    [facility] [address] [place] [address] [phone] Schedule an appointment as soon as possible for a visit       Jaileen Janelle, Canary Brim, MD 02/03/18 [phone]

## 2018-03-23 ENCOUNTER — Encounter (HOSPITAL_BASED_OUTPATIENT_CLINIC_OR_DEPARTMENT_OTHER): Payer: Self-pay | Admitting: Emergency Medicine

## 2018-03-23 ENCOUNTER — Other Ambulatory Visit: Payer: Self-pay

## 2018-03-23 ENCOUNTER — Emergency Department (HOSPITAL_BASED_OUTPATIENT_CLINIC_OR_DEPARTMENT_OTHER)
Admission: EM | Admit: 2018-03-23 | Discharge: 2018-03-23 | Disposition: A | Discharge status: Home / Self Care | Payer: Managed Care, Other (non HMO) | Attending: Emergency Medicine | Admitting: Emergency Medicine

## 2018-03-23 DIAGNOSIS — J069 Acute upper respiratory infection, unspecified: Secondary | ICD-10-CM | POA: Insufficient documentation

## 2018-03-23 DIAGNOSIS — R509 Fever, unspecified: Secondary | ICD-10-CM | POA: Diagnosis present

## 2018-03-23 DIAGNOSIS — B349 Viral infection, unspecified: Secondary | ICD-10-CM

## 2018-03-23 NOTE — ED Triage Notes (Signed)
Pt having nasal congestion last night and woke up this am with fever.  Pt took tylenol at 1:30 am.  Pt had to miss work today due to fever.

## 2018-03-23 NOTE — Discharge Instructions (Signed)
Given the small congestion and the fever he had yesterday in the absence of other symptoms, we suspect you have a viral upper respiratory infection.  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  We have a very low suspicion for bacterial infection at this time.  As you had a fever this morning, you need to be out of work until you are 24 hours of afebrile status.  Please stay hydrated and rest.  If any symptoms change or worsen, please return to the nearest emergency department.

## 2018-03-23 NOTE — ED Provider Notes (Signed)
[facility] EMERGENCY DEPARTMENT Provider Note   CSN: [id number] Arrival date & time: 03/23/18  0820     History   Chief Complaint Chief Complaint  Patient presents with  . Fever  . Nasal Congestion    HPI Tamara Cruz is a 22 y.o. female.  The history is provided by the patient and medical records. No language interpreter was used.  Fever   This is a new problem. The current episode started 12 to 24 hours ago. The problem occurs constantly. The problem has been resolved. The maximum temperature noted was 102 to 102.9 F. The temperature was taken using an oral thermometer. Associated symptoms include congestion and cough. Pertinent negatives include no chest pain, no sleepiness, no diarrhea, no vomiting, no headaches and no sore throat. She has tried acetaminophen for the symptoms. The treatment provided significant relief.    Past Medical History:  Diagnosis Date  . Menstrual cramps     There are no active problems to display for this patient.   No past surgical history on file.   OB History   None      Home Medications    Prior to Admission medications   Medication Sig Start Date End Date Taking? Authorizing Provider  medroxyPROGESTERone (DEPO-PROVERA) 150 MG/ML injection Inject 150 mg into the muscle every 3 (three) months.   Yes [provider]    Family History No family history on file.  Social History Social History   Tobacco Use  . Smoking status: Never Smoker  . Smokeless tobacco: Never Used  Substance Use Topics  . Alcohol use: No  . Drug use: No     Allergies   Pineapple   Review of Systems Review of Systems  Constitutional: Positive for fever. Negative for chills, diaphoresis and fatigue.  HENT: Positive for congestion and rhinorrhea. Negative for sore throat.   Eyes: Negative for visual disturbance.  Respiratory: Positive for cough. Negative for chest tightness, shortness of breath, wheezing and stridor.     Cardiovascular: Negative for chest pain, palpitations and leg swelling.  Gastrointestinal: Negative for constipation, diarrhea, nausea and vomiting.  Genitourinary: Negative for dysuria and frequency.  Musculoskeletal: Negative for back pain, neck pain and neck stiffness.  Neurological: Negative for dizziness and headaches.  Psychiatric/Behavioral: Negative for agitation.  All other systems reviewed and are negative.    Physical Exam Updated Vital Signs BP 124/86 (BP Location: Right Arm)   Pulse 95   Temp 97.7 F (36.5 C) (Oral)   Resp 18   Ht 5\' 1"  (1.549 m)   Wt 56.7 kg   SpO2 100%   BMI 23.62 kg/m   Physical Exam  Constitutional: She appears well-developed and well-nourished. No distress.  HENT:  Head: Normocephalic and atraumatic.  Nose: Rhinorrhea present.  Mouth/Throat: No oropharyngeal exudate.  Visible rhinorrhea and audible congestion appreciated in the oropharynx and nose.  No stridor.  No evidence of PTA or RPA.  Eyes: Conjunctivae are normal.  Neck: Neck supple.  Cardiovascular: Normal rate and regular rhythm.  No murmur heard. Pulmonary/Chest: Effort normal and breath sounds normal. No respiratory distress. She has no wheezes. She exhibits no tenderness.  Abdominal: Soft. There is no tenderness.  Musculoskeletal: She exhibits no edema.  Neurological: She is alert.  Skin: Skin is warm and dry. Capillary refill takes less than 2 seconds. No rash noted. She is not diaphoretic. No erythema.  Psychiatric: She has a normal mood and affect.  Nursing note and vitals reviewed.  ED Treatments / Results  Labs (all labs ordered are listed, but only abnormal results are displayed) Labs Reviewed - No data to display  EKG None  Radiology No results found.  Procedures Procedures (including critical care time)  Medications Ordered in ED Medications - No data to display   Initial Impression / Assessment and Plan / ED Course  I have reviewed the triage  vital signs and the nursing notes.  Pertinent labs & imaging results that were available during my care of the patient were reviewed by me and considered in my medical decision making (see chart for details).     Tamara Cruz is a 22 y.o. female with no significant past medical history who presents with fever, rhinorrhea, and congestion.  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  She reports that there has been illness at school.  She reports that her last several days she has had development of rhinorrhea and congestion but overnight spiked a fever of 102.  She was told to miss school today and come to the emergency department for evaluation.  She denies significant reductive cough, nausea vomiting, urinary symptoms.  She does report a dry cough and the URI symptoms.  No other complaints.  No headache, neck pain, neck stiffness, rashes, or other complaints.  She reports taking Tylenol that improved her fever.    On exam, patient has rhinorrhea and congestion.  Ears showed no evidence of otitis media, lungs clear.  Exam otherwise unremarkable.  Given reassuring history and exam, suspect a viral infection.  Do not feel she needs x-ray or urine at this time.  Patient advised to stay out of work until she is 24 hours fever free as she works at a school.  Patient will stay hydrated and follow-up with a PCP.  Patient was understanding the plan of care as well as return precautions for any new or worsened symptoms.  No other questions or concerns and was discharged in good condition.  Final Clinical Impressions(s) / ED Diagnoses   Final diagnoses:  Upper respiratory tract infection, unspecified type  Viral illness    ED Discharge Orders    None     Clinical Impression: 1. Upper respiratory tract infection, unspecified type   2. Viral illness     Disposition: Discharge  Condition: Good  I have discussed the  results, Dx and Tx plan with the pt(& family if present). He/she/they expressed understanding and agree(s) with the plan. Discharge instructions discussed at great length. Strict return precautions discussed and pt &/or family have verbalized understanding of the instructions. No further questions at time of discharge.    New Prescriptions   No medications on file    [facility]: [facility] [address] [place] [phone] Schedule an appointment as soon as possible for a visit    [facility] [address] [id number] [address] [id number] [phone]       , Canary Brimhristopher J, MD 03/23/18 [phone]

## 2018-04-21 ENCOUNTER — Encounter (HOSPITAL_BASED_OUTPATIENT_CLINIC_OR_DEPARTMENT_OTHER): Payer: Self-pay | Admitting: Emergency Medicine

## 2018-04-21 ENCOUNTER — Other Ambulatory Visit: Payer: Self-pay

## 2018-04-21 ENCOUNTER — Emergency Department (HOSPITAL_BASED_OUTPATIENT_CLINIC_OR_DEPARTMENT_OTHER)
Admission: EM | Admit: 2018-04-21 | Discharge: 2018-04-21 | Disposition: A | Discharge status: Home / Self Care | Payer: Managed Care, Other (non HMO) | Attending: Emergency Medicine | Admitting: Emergency Medicine

## 2018-04-21 DIAGNOSIS — R197 Diarrhea, unspecified: Secondary | ICD-10-CM | POA: Diagnosis not present

## 2018-04-21 DIAGNOSIS — Z3202 Encounter for pregnancy test, result negative: Secondary | ICD-10-CM | POA: Insufficient documentation

## 2018-04-21 DIAGNOSIS — R1084 Generalized abdominal pain: Secondary | ICD-10-CM | POA: Diagnosis present

## 2018-04-21 DIAGNOSIS — R112 Nausea with vomiting, unspecified: Secondary | ICD-10-CM | POA: Diagnosis not present

## 2018-04-21 LAB — COMPREHENSIVE METABOLIC PANEL
ALK PHOS: 53 U/L (ref 38–126)
ALT: 18 U/L (ref 0–44)
AST: 18 U/L (ref 15–41)
Albumin: 4.2 g/dL (ref 3.5–5.0)
Anion gap: 8 (ref 5–15)
BUN: 9 mg/dL (ref 6–20)
CALCIUM: 9.1 mg/dL (ref 8.9–10.3)
CO2: 21 mmol/L — AB (ref 22–32)
CREATININE: 0.96 mg/dL (ref 0.44–1.00)
Chloride: 108 mmol/L (ref 98–111)
GFR calc Af Amer: >60 mL/min (ref >60)
GFR calc non Af Amer: >60 mL/min (ref >60)
GLUCOSE: 91 mg/dL (ref 70–99)
Potassium: 3.5 mmol/L (ref 3.5–5.1)
SODIUM: 137 mmol/L (ref 135–145)
Total Bilirubin: 0.3 mg/dL (ref 0.3–1.2)
Total Protein: 7.4 g/dL (ref 6.5–8.1)

## 2018-04-21 LAB — CBC
HCT: 46.8 % — ABNORMAL HIGH (ref 36.0–46.0)
Hemoglobin: 14.4 g/dL (ref 12.0–15.0)
MCH: 26.4 pg (ref 26.0–34.0)
MCHC: 30.8 g/dL (ref 30.0–36.0)
MCV: 85.9 fL (ref 80.0–100.0)
PLATELETS: 230 10*3/uL (ref 150–400)
RBC: 5.45 MIL/uL — ABNORMAL HIGH (ref 3.87–5.11)
RDW: 14 % (ref 11.5–15.5)
WBC: 5.3 10*3/uL (ref 4.0–10.5)
nRBC: 0 % (ref 0.0–0.2)

## 2018-04-21 LAB — URINALYSIS, ROUTINE W REFLEX MICROSCOPIC
BILIRUBIN URINE: NEGATIVE
GLUCOSE, UA: NEGATIVE mg/dL
KETONES UR: NEGATIVE mg/dL
Leukocytes, UA: NEGATIVE
Nitrite: POSITIVE — AB
PROTEIN: NEGATIVE mg/dL
Specific Gravity, Urine: 1.01 (ref 1.005–1.030)
pH: 7 (ref 5.0–8.0)

## 2018-04-21 LAB — LIPASE, BLOOD: Lipase: 26 U/L (ref 11–51)

## 2018-04-21 LAB — URINALYSIS, MICROSCOPIC (REFLEX)

## 2018-04-21 LAB — PREGNANCY, URINE: Preg Test, Ur: NEGATIVE

## 2018-04-21 MED ORDER — ONDANSETRON 4 MG PO TBDP
4.0000 mg | ORAL_TABLET | Freq: Once | ORAL | Status: AC
  Administered 2018-04-21: 4 mg via ORAL
  Filled 2018-04-21: qty 1

## 2018-04-21 MED ORDER — ONDANSETRON 4 MG PO TBDP: 4.0000 mg | ORAL_TABLET | Freq: Three times a day (TID) | ORAL | 0 refills | Status: DC | PRN

## 2018-04-21 NOTE — ED Provider Notes (Signed)
[facility] EM[facility]GENC[facility] DEPARTMENT Provider Note   CSN: [id number] Arrival date & time: 04/21/18  0941     History   Chief Complaint Chief Complaint  Patient presents with  . Abdominal Pain    HPI Tamara Cruz is a 22 y.o. female.  The history is provided by the patient. No language interpreter was used.  Abdominal Pain   This is a new problem. The current episode started yesterday. The problem occurs constantly. The problem has not changed since onset.The pain is located in the generalized abdominal region. The pain is at a severity of 2/10. Associated symptoms include diarrhea, nausea and vomiting. Nothing relieves the symptoms.  Pt complains of vomitting x12 hours, now diarrhea  Past Medical History:  Diagnosis Date  . Menstrual cramps     There are no active problems to display for this patient.   History reviewed. No pertinent surgical history.   OB History   No obstetric history on file.      Home Medications    Prior to Admission medications   Medication Sig Start Date End Date Taking? Authorizing Provider  medroxyPROGESTERone (DEPO-PROVERA) 150 MG/ML injection Inject 150 mg into the muscle every 3 (three) months.    [provider]  ondansetron (ZOFRAN ODT) 4 MG disintegrating tablet Take 1 tablet (4 mg total) by mouth every 8 (eight) hours as needed for nausea or vomiting. 04/21/18   Elson AreasSofia,  K, PA-C    Family History History reviewed. No pertinent family history.  Social History Social History   Tobacco Use  . Smoking status: Never Smoker  . Smokeless tobacco: Never Used  Substance Use Topics  . Alcohol use: No  . Drug use: No     Allergies   Pineapple   Review of Systems Review of Systems  Gastrointestinal: Positive for abdominal pain, diarrhea, nausea and vomiting.  All other systems reviewed and are negative.    Physical Exam Updated Vital Signs BP 133/85 (BP Location: Left Arm)   Pulse 83   Temp 97.8  F (36.6 C) (Oral)   Resp 16   Ht 5' (1.524 m)   Wt 57.6 kg   SpO2 100%   BMI 24.80 kg/m   Physical Exam Vitals signs and nursing note reviewed.  Constitutional:      Appearance: She is well-developed.  HENT:     Head: Normocephalic.     Mouth/Throat:     Mouth: Mucous membranes are moist.  Eyes:     Extraocular Movements: Extraocular movements intact.     Pupils: Pupils are equal, round, and reactive to light.  Neck:     Musculoskeletal: Normal range of motion.  Cardiovascular:     Rate and Rhythm: Normal rate.  Pulmonary:     Effort: Pulmonary effort is normal.  Abdominal:     General: Bowel sounds are normal. There is no distension.     Palpations: Abdomen is soft.     Tenderness: There is abdominal tenderness.  Musculoskeletal: Normal range of motion.  Skin:    General: Skin is warm.  Neurological:     General: No focal deficit present.     Mental Status: She is alert and oriented to person, place, and time.      ED Treatments / Results  Labs (all labs ordered are listed, but only abnormal results are displayed) Labs Reviewed  COMPREHENSIVE METABOLIC PANEL - Abnormal; Notable for the following components:      Result Value   CO2 21 (*)  All other components within normal limits  CBC - Abnormal; Notable for the following components:   RBC 5.45 (*)    HCT 46.8 (*)    All other components within normal limits  URINALYSIS, ROUTINE W REFLEX MICROSCOPIC - Abnormal; Notable for the following components:   APPearance HAZY (*)    Hgb urine dipstick SMALL (*)    Nitrite POSITIVE (*)    All other components within normal limits  URINALYSIS, MICROSCOPIC (REFLEX) - Abnormal; Notable for the following components:   Bacteria, UA MANY (*)    All other components within normal limits  LIPASE, BLOOD  PREGNANCY, URINE    EKG None  Radiology No results found.  Procedures Procedures (including critical care time)  Medications Ordered in ED Medications    ondansetron (ZOFRAN-ODT) disintegrating tablet 4 mg (4 mg Oral Given 04/21/18 1019)     Initial Impression / Assessment and Plan / ED Course  I have reviewed the triage vital signs and the nursing notes.  Pertinent labs & imaging results that were available during my care of the patient were reviewed by me and considered in my medical decision making (see chart for details).     MDM Pt feels better after zofran.  Pt tolerating fluids.   Final Clinical Impressions(s) / ED Diagnoses   Final diagnoses:  Nausea vomiting and diarrhea    ED Discharge Orders         Ordered    ondansetron (ZOFRAN ODT) 4 MG disintegrating tablet  Every 8 hours PRN     04/21/18 1107        An After Visit Summary was printed and given to the patient.    Elson Areas, PA-C 04/21/18 1143    Geoffery Lyons, MD 04/21/18 [phone]

## 2018-04-21 NOTE — ED Triage Notes (Signed)
Reports abdominal pain since Sunday.  C/o nausea and diarrhea.

## 2018-04-21 NOTE — Discharge Instructions (Signed)
Return if any problems.

## 2018-04-21 NOTE — ED Notes (Signed)
Pt given water for fluid challenge 

## 2018-06-23 ENCOUNTER — Emergency Department (HOSPITAL_BASED_OUTPATIENT_CLINIC_OR_DEPARTMENT_OTHER)
Admission: EM | Admit: 2018-06-23 | Discharge: 2018-06-23 | Disposition: A | Discharge status: Home / Self Care | Payer: Managed Care, Other (non HMO) | Attending: Emergency Medicine | Admitting: Emergency Medicine

## 2018-06-23 ENCOUNTER — Encounter (HOSPITAL_BASED_OUTPATIENT_CLINIC_OR_DEPARTMENT_OTHER): Payer: Self-pay

## 2018-06-23 ENCOUNTER — Other Ambulatory Visit: Payer: Self-pay

## 2018-06-23 DIAGNOSIS — J111 Influenza due to unidentified influenza virus with other respiratory manifestations: Secondary | ICD-10-CM

## 2018-06-23 DIAGNOSIS — R69 Illness, unspecified: Secondary | ICD-10-CM

## 2018-06-23 DIAGNOSIS — J029 Acute pharyngitis, unspecified: Secondary | ICD-10-CM | POA: Diagnosis present

## 2018-06-23 MED ORDER — FLUTICASONE PROPIONATE 50 MCG/ACT NA SUSP: 1.0000 | Freq: Every day | NASAL | 0 refills | Status: DC

## 2018-06-23 MED ORDER — NAPROXEN 500 MG PO TABS: 500.0000 mg | ORAL_TABLET | Freq: Two times a day (BID) | ORAL | 0 refills | Status: DC

## 2018-06-23 NOTE — Discharge Instructions (Addendum)
You were seen in the ER for flu like symptoms. Please use flonase for nasal congestion & naproxen for pain. - Naproxen is a nonsteroidal anti-inflammatory medication that will help with pain and swelling. Be sure to take this medication as prescribed with food, 1 pill every 12 hours,  It should be taken with food, as it can cause stomach upset, and more seriously, stomach bleeding. Do not take other nonsteroidal anti-inflammatory medications with this such as Advil, Motrin, Aleve, Mobic, Goodie Powder, or Motrin.    You make take Tylenol per over the counter dosing with these medications.   We have prescribed you new medication(s) today. Discuss the medications prescribed today with your pharmacist as they can have adverse effects and interactions with your other medicines including over the counter and prescribed medications. Seek medical evaluation if you start to experience new or abnormal symptoms after taking one of these medicines, seek care immediately if you start to experience difficulty breathing, feeling of your throat closing, facial swelling, or rash as these could be indications of a more serious allergic reaction   Stay well hydrated & be sure to rest.  Follow up with primary care within 1 week. Return to the ER for new or worsening symptoms or any other concerns.

## 2018-06-23 NOTE — ED Triage Notes (Signed)
C/o URI sx x today-NAD-steady gait

## 2018-06-23 NOTE — ED Provider Notes (Signed)
[facility] EMERGENCY DEPARTMENT Provider Note   CSN: [id number] Arrival date & time: 06/23/18  1243    History   Chief Complaint Chief Complaint  Patient presents with  . URI    HPI Tamara Cruz is a 23 y.o. female without significant past medical hx who presents to the ED with complaints of URI sxs that started this AM when she woke up. She reports congestion, sore throat, chills, body aches, and fever w/ temp of 102 (improved following tylenol). Sore throat is mild, 4/10 in severity, more with swallowing, able to swallow. Denies cough, chest pain, dyspnea, neck stiffness, abdominal pain, or dysuria. Denies chance of pregnancy. Step daughter sick with somewhat similar sxs      HPI  Past Medical History:  Diagnosis Date  . Menstrual cramps     There are no active problems to display for this patient.   History reviewed. No pertinent surgical history.   OB History   No obstetric history on file.      Home Medications    Prior to Admission medications   Medication Sig Start Date End Date Taking? Authorizing Provider  medroxyPROGESTERone (DEPO-PROVERA) 150 MG/ML injection Inject 150 mg into the muscle every 3 (three) months.    [provider]  ondansetron (ZOFRAN ODT) 4 MG disintegrating tablet Take 1 tablet (4 mg total) by mouth every 8 (eight) hours as needed for nausea or vomiting. 04/21/18   Elson Areas, PA-C    Family History No family history on file.  Social History Social History   Tobacco Use  . Smoking status: Never Smoker  . Smokeless tobacco: Never Used  Substance Use Topics  . Alcohol use: No  . Drug use: No     Allergies   Pineapple   Review of Systems Review of Systems  Constitutional: Positive for chills and fever.  HENT: Positive for congestion, sore throat and trouble swallowing (painful but able). Negative for ear pain.   Respiratory: Negative for cough and shortness of breath.   Cardiovascular: Negative  for chest pain.  Gastrointestinal: Negative for abdominal pain.  Genitourinary: Negative for dysuria.  Musculoskeletal: Positive for myalgias. Negative for neck stiffness.  Neurological: Negative for headaches.     Physical Exam Updated Vital Signs BP 109/67 (BP Location: Left Arm)   Pulse 77   Temp 98.2 F (36.8 C) (Oral)   Resp 16   Ht 5\' 1"  (1.549 m)   Wt 49 kg   SpO2 100%   BMI 20.41 kg/m   Physical Exam Vitals signs and nursing note reviewed.  Constitutional:      General: She is not in acute distress.    Appearance: She is well-developed.  HENT:     Head: Normocephalic and atraumatic.     Right Ear: Tympanic membrane is not perforated, erythematous, retracted or bulging.     Left Ear: Tympanic membrane is not perforated, erythematous, retracted or bulging.     Nose: Congestion present.     Comments: No sinus tenderness.     Mouth/Throat:     Mouth: Mucous membranes are moist.     Pharynx: Uvula midline. No oropharyngeal exudate or posterior oropharyngeal erythema.     Comments: Posterior oropharynx is symmetric appearing. Patient tolerating own secretions without difficulty. No trismus. No drooling. No hot potato voice. No swelling beneath the tongue, submandibular compartment is soft.  Eyes:     General:        Right eye: No discharge.  Left eye: No discharge.     Conjunctiva/sclera: Conjunctivae normal.     Pupils: Pupils are equal, round, and reactive to light.  Neck:     Musculoskeletal: Normal range of motion and neck supple. No neck rigidity.  Cardiovascular:     Rate and Rhythm: Normal rate and regular rhythm.     Heart sounds: No murmur.  Pulmonary:     Effort: No respiratory distress.     Breath sounds: Normal breath sounds. No wheezing or rales.  Abdominal:     General: There is no distension.     Palpations: Abdomen is soft.     Tenderness: There is no abdominal tenderness.  Lymphadenopathy:     Cervical: No cervical adenopathy.  Skin:     General: Skin is warm and dry.     Findings: No rash.  Neurological:     Mental Status: She is alert.  Psychiatric:        Behavior: Behavior normal.    ED Treatments / Results  Labs (all labs ordered are listed, but only abnormal results are displayed) Labs Reviewed - No data to display  EKG None  Radiology No results found.  Procedures Procedures (including critical care time)  Medications Ordered in ED Medications - No data to display   Initial Impression / Assessment and Plan / ED Course  I have reviewed the triage vital signs and the nursing notes.  Pertinent labs & imaging results that were available during my care of the patient were reviewed by me and considered in my medical decision making (see chart for details).     Patient presents to the ED with flu like sxs that started this AM. Nontoxic appearing, in no apparent distress, vitals WNL. No cough, lungs clear to auscultation, doubt pneumonia. There is no wheezing or signs of respiratory distress. Sxs onset < 7 days, no sinus tenderness, doubt acute bacterial sinusitis. Centor score 1 for reported fever this AM, doubt strep pharyngitis, exam not consistent with RPA/PTA. No evidence of AOM on exam. No meningeal signs. Presentation consistent with influenza like illness, patient without comorbidities to necessitate tamiflu- we discussed option of this medicine including risks/benefits, patient declined which I am in agreement with. Supportive care. I discussed treatment plan, need for PCP follow-up, and return precautions with the patient. Provided opportunity for questions, patient confirmed understanding and is in agreement with plan.   Final Clinical Impressions(s) / ED Diagnoses   Final diagnoses:  Influenza-like illness    ED Discharge Orders         Ordered    fluticasone (FLONASE) 50 MCG/ACT nasal spray  Daily     06/23/18 1306    naproxen (NAPROSYN) 500 MG tablet  2 times daily     06/23/18 1306            Choya Tornow, [place] R, PA-C 06/23/18 1315    Little, Ambrose Finland, MD 06/23/18 [phone]

## 2019-02-17 ENCOUNTER — Emergency Department (HOSPITAL_BASED_OUTPATIENT_CLINIC_OR_DEPARTMENT_OTHER)
Admission: EM | Admit: 2019-02-17 | Discharge: 2019-02-17 | Disposition: A | Discharge status: Home / Self Care | Payer: Managed Care, Other (non HMO) | Attending: Emergency Medicine | Admitting: Emergency Medicine

## 2019-02-17 ENCOUNTER — Encounter (HOSPITAL_BASED_OUTPATIENT_CLINIC_OR_DEPARTMENT_OTHER): Payer: Self-pay | Admitting: Emergency Medicine

## 2019-02-17 ENCOUNTER — Other Ambulatory Visit: Payer: Self-pay

## 2019-02-17 ENCOUNTER — Emergency Department (HOSPITAL_BASED_OUTPATIENT_CLINIC_OR_DEPARTMENT_OTHER): Payer: Managed Care, Other (non HMO)

## 2019-02-17 DIAGNOSIS — M7918 Myalgia, other site: Secondary | ICD-10-CM

## 2019-02-17 DIAGNOSIS — Z79899 Other long term (current) drug therapy: Secondary | ICD-10-CM | POA: Diagnosis not present

## 2019-02-17 DIAGNOSIS — F17290 Nicotine dependence, other tobacco product, uncomplicated: Secondary | ICD-10-CM | POA: Insufficient documentation

## 2019-02-17 DIAGNOSIS — R079 Chest pain, unspecified: Secondary | ICD-10-CM | POA: Diagnosis present

## 2019-02-17 MED ORDER — METHOCARBAMOL 500 MG PO TABS: 1000.0000 mg | ORAL_TABLET | Freq: Four times a day (QID) | ORAL | 0 refills | Status: AC

## 2019-02-17 MED ORDER — NAPROXEN 500 MG PO TABS: 500.0000 mg | ORAL_TABLET | Freq: Two times a day (BID) | ORAL | 0 refills | Status: AC

## 2019-02-17 NOTE — Discharge Instructions (Signed)
Please read and follow all provided instructions.  Your diagnoses today include:  1. Musculoskeletal pain     Tests performed today include:  An EKG of your heart  A chest x-ray  Vital signs. See below for your results today.   Medications prescribed:   Naproxen - anti-inflammatory pain medication  Do not exceed 500mg  naproxen every 12 hours, take with food  You have been prescribed an anti-inflammatory medication or NSAID. Take with food. Take smallest effective dose for the shortest duration needed for your pain. Stop taking if you experience stomach pain or vomiting.    Robaxin (methocarbamol) - muscle relaxer medication  DO NOT drive or perform any activities that require you to be awake and alert because this medicine can make you drowsy.   Take any prescribed medications only as directed.  Follow-up instructions: Please follow-up with your primary care provider as soon as you can for further evaluation of your symptoms.   Return instructions:  SEEK IMMEDIATE MEDICAL ATTENTION IF:  You have worsening shortness of breath or trouble breathing  You have severe chest pain, especially if the pain is crushing or pressure-like and spreads to the arms, back, neck, or jaw, or if you have sweating, nausea (feeling sick to your stomach), or shortness of breath.   Your chest pain gets worse and does not go away with rest.   You have an attack of chest pain lasting longer than usual, despite rest and treatment with the medications your caregiver has prescribed.   You feel dizzy or faint.  You have chest pain not typical of your usual pain for which you originally saw your caregiver.   You have any other emergent concerns regarding your health.  Additional Information: Chest pain comes from many different causes. Your caregiver has diagnosed you as having chest pain that is not specific for one problem, but does not require admission.  You are at low risk for an acute heart  condition or other serious illness.   Your vital signs today were: BP 127/76 (BP Location: Right Arm)    Pulse 98    Temp 98.7 F (37.1 C) (Oral)    Resp 16    Ht 5\' 1"  (1.549 m)    Wt 49.3 kg    SpO2 98%    BMI 20.54 kg/m  If your blood pressure (BP) was elevated above 135/85 this visit, please have this repeated by your doctor within one month. --------------

## 2019-02-17 NOTE — ED Provider Notes (Signed)
[facility] EMERGENCY DEPARTMENT Provider Note   CSN: [id number] Arrival date & time: 02/17/19  0854     History   Chief Complaint Chief Complaint  Patient presents with  . Chest Pain    HPI Tamara Cruz is a 23 y.o. female.     Patient with no significant past medical history other than smoking presents to the emergency department today with complaints of left upper chest pain, left upper back pain with radiation into her left shoulder over the past 5 days.  Pain is intermittent, worse in the morning and at night.  Pain is worse with movement.  Patient has not had any associated shortness of breath or cough.  No fevers or chills.  No nausea, vomiting, or diarrhea.  No abdominal pain.  Patient does report lifting boxes very frequently and repetitively at work.  She has taken Pepto-Bismol without any relief.  She denies any changes with food.  She states that it hurts more when she laughs or takes a very deep breath.  Patient denies risk factors for pulmonary embolism including: unilateral leg swelling, history of DVT/PE/other blood clots, use of estrogen, recent immobilizations, recent surgery, recent travel (>4hr segment), malignancy, hemoptysis.  Patient takes Depo-Provera shot for birth control.      Past Medical History:  Diagnosis Date  . Menstrual cramps     There are no active problems to display for this patient.   History reviewed. No pertinent surgical history.   OB History   No obstetric history on file.      Home Medications    Prior to Admission medications   Medication Sig Start Date End Date Taking? Authorizing Provider  medroxyPROGESTERone (DEPO-PROVERA) 150 MG/ML injection Inject 150 mg into the muscle every 3 (three) months.    [provider]    Family History No family history on file.  Social History Social History   Tobacco Use  . Smoking status: Current Every Day Smoker    Types: Cigars  . Smokeless tobacco: Never  Used  Substance Use Topics  . Alcohol use: No    Comment: occ  . Drug use: Yes    Types: Marijuana    Comment: last smoked yesterday     Allergies   Pineapple   Review of Systems Review of Systems  Constitutional: Negative for diaphoresis and fever.  Eyes: Negative for redness.  Respiratory: Negative for cough and shortness of breath.   Cardiovascular: Positive for chest pain. Negative for palpitations and leg swelling.  Gastrointestinal: Negative for abdominal pain, nausea and vomiting.  Genitourinary: Negative for dysuria.  Musculoskeletal: Positive for myalgias. Negative for back pain and neck pain.  Skin: Negative for rash.  Neurological: Negative for syncope and light-headedness.  Psychiatric/Behavioral: The patient is not nervous/anxious.      Physical Exam Updated Vital Signs BP 127/76 (BP Location: Right Arm)   Pulse 98   Temp 98.7 F (37.1 C) (Oral)   Resp 16   Ht 5\' 1"  (1.549 m)   Wt 49.3 kg   SpO2 98%   BMI 20.54 kg/m   Physical Exam Vitals signs and nursing note reviewed.  Constitutional:      Appearance: She is well-developed. She is not diaphoretic.  HENT:     Head: Normocephalic and atraumatic.     Mouth/Throat:     Mouth: Mucous membranes are not dry.  Eyes:     Conjunctiva/sclera: Conjunctivae normal.  Neck:     Musculoskeletal: Normal range of motion and neck  supple. No muscular tenderness.     Vascular: Normal carotid pulses. No carotid bruit or JVD.     Trachea: Trachea normal. No tracheal deviation.  Cardiovascular:     Rate and Rhythm: Normal rate and regular rhythm.     Pulses: No decreased pulses.     Heart sounds: Normal heart sounds, S1 normal and S2 normal. No murmur.  Pulmonary:     Effort: Pulmonary effort is normal. No respiratory distress.     Breath sounds: No wheezing.  Chest:     Chest wall: Tenderness present.    Abdominal:     General: Bowel sounds are normal.     Palpations: Abdomen is soft.     Tenderness:  There is no abdominal tenderness. There is no guarding or rebound.  Musculoskeletal: Normal range of motion.     Cervical back: She exhibits normal range of motion, no tenderness and no bony tenderness.     Thoracic back: She exhibits tenderness and spasm. She exhibits normal range of motion and no bony tenderness.     Lumbar back: She exhibits normal range of motion, no tenderness and no bony tenderness.       Back:     Right lower leg: She exhibits no tenderness. No edema.     Left lower leg: She exhibits no tenderness. No edema.     Comments: Full range of motion of the upper extremities however patient does appear uncomfortable when she raises her left arm stating that she gets worsening pain in her upper back and scapular area as well as the proximal upper arm.  Skin:    General: Skin is warm and dry.     Coloration: Skin is not pale.  Neurological:     Mental Status: She is alert.      ED Treatments / Results  Labs (all labs ordered are listed, but only abnormal results are displayed) Labs Reviewed - No data to display  EKG EKG Interpretation  Date/Time:  Wednesday February 17 2019 09:09:33 EDT Ventricular Rate:  108 PR Interval:    QRS Duration: 83 QT Interval:  308 QTC Calculation: 413 R Axis:   97 Text Interpretation:  Sinus tachycardia Ventricular premature complex Borderline right axis deviation Borderline T wave abnormalities Artifact Confirmed by Gwyneth SproutPlunkett, Whitney ([id number]) on 02/17/2019 9:36:31 AM   Radiology No results found.  Procedures Procedures (including critical care time)  Medications Ordered in ED Medications - No data to display   Initial Impression / Assessment and Plan / ED Course  I have reviewed the triage vital signs and the nursing notes.  Pertinent labs & imaging results that were available during my care of the patient were reviewed by me and considered in my medical decision making (see chart for details).        Patient seen and  examined.  Reviewed EKG.  Heart rate is in the 70s at rest.  Patient is in no distress.  Patient has reproducible pain in her chest and upper back, suggestive of musculoskeletal pain in setting of repetitive lifting.  Considered pericarditis, PE, ACS however she does not have any symptoms which are overly compelling for these etiologies at this time.  Will obtain chest x-ray to assess underlying lung and bones.  Vital signs reviewed and are as follows: BP 127/76 (BP Location: Right Arm)   Pulse 98   Temp 98.7 F (37.1 C) (Oral)   Resp 16   Ht 5\' 1"  (1.549 m)   Wt 49.3 kg  SpO2 98%   BMI 20.54 kg/m   10:53 AM chest x-ray is negative.  Patient informed.  We will discharged home at this time with naproxen and Robaxin to use at bedtime.  Patient verbalizes understanding agrees with plan.  Encouraged rest of the chest and shoulder area.  We discussed that although we feel other causes of chest pain are unlikely, she needs to return if she develops worsening pain in her chest, trouble breathing, fever, shortness of breath, or she has any other concerns.  Patient verbalizes understanding agrees with plan.  Final Clinical Impressions(s) / ED Diagnoses   Final diagnoses:  Musculoskeletal pain   Patient with left upper chest and back pain with some pain in the proximal upper arm as well.  This is very reproducible in nature.  Worse with movement and palpation.  It is somewhat positional.  Considered pericarditis however no concerning EKG findings.  No signs of failure to suggest pericardial effusion.  Considered ACS however symptoms are atypical and patient does not have risk factors.  Considered PE however patient is not hypoxic.  She was tachycardic on arrival, suspect in part due to anxiety, she has been persistently in the 60s and 70s while at rest in the room.  Do not feel that further work-up for PE is indicated given her history at this time.  Patient seems reliable to return if any symptoms  worsen.  ED Discharge Orders         Ordered    naproxen (NAPROSYN) 500 MG tablet  2 times daily     02/17/19 1051    methocarbamol (ROBAXIN) 500 MG tablet  4 times daily     02/17/19 1051           Carlisle Cater, PA-C 02/17/19 1055    Blanchie Dessert, MD 02/17/19 1459

## 2019-02-17 NOTE — ED Triage Notes (Signed)
Left upper chest pain x5 days.  Only happens when she lays down at night.  Pain and numbness travels down left arm. Once she goes to sleep she is fine until she wakes up and the pain is back in the morning.  Worse with movement or a deep breath.  Once she gets up and moves around it stops and doesn't come back until she goes back to bed.

## 2019-04-05 ENCOUNTER — Other Ambulatory Visit: Payer: Self-pay

## 2019-04-05 ENCOUNTER — Encounter (HOSPITAL_BASED_OUTPATIENT_CLINIC_OR_DEPARTMENT_OTHER): Payer: Self-pay | Admitting: *Deleted

## 2019-04-05 ENCOUNTER — Emergency Department (HOSPITAL_BASED_OUTPATIENT_CLINIC_OR_DEPARTMENT_OTHER)
Admission: EM | Admit: 2019-04-05 | Discharge: 2019-04-05 | Disposition: A | Discharge status: Home / Self Care | Payer: Managed Care, Other (non HMO) | Attending: Emergency Medicine | Admitting: Emergency Medicine

## 2019-04-05 DIAGNOSIS — Z79899 Other long term (current) drug therapy: Secondary | ICD-10-CM | POA: Diagnosis not present

## 2019-04-05 DIAGNOSIS — H5789 Other specified disorders of eye and adnexa: Secondary | ICD-10-CM | POA: Diagnosis present

## 2019-04-05 DIAGNOSIS — F17210 Nicotine dependence, cigarettes, uncomplicated: Secondary | ICD-10-CM | POA: Insufficient documentation

## 2019-04-05 DIAGNOSIS — H1032 Unspecified acute conjunctivitis, left eye: Secondary | ICD-10-CM | POA: Insufficient documentation

## 2019-04-05 MED ORDER — TETRACAINE HCL 0.5 % OP SOLN
1.0000 [drp] | Freq: Once | OPHTHALMIC | Status: AC
  Administered 2019-04-05: 1 [drp] via OPHTHALMIC
  Filled 2019-04-05: qty 4

## 2019-04-05 MED ORDER — FLUORESCEIN SODIUM 1 MG OP STRP
1.0000 | ORAL_STRIP | Freq: Once | OPHTHALMIC | Status: AC
  Administered 2019-04-05: 1 via OPHTHALMIC
  Filled 2019-04-05: qty 1

## 2019-04-05 MED ORDER — ERYTHROMYCIN 5 MG/GM OP OINT: TOPICAL_OINTMENT | OPHTHALMIC | 0 refills | Status: AC

## 2019-04-05 NOTE — ED Provider Notes (Signed)
[facility] EMERGENCY DEPARTMENT Provider Note   CSN: [id number] Arrival date & time: 04/05/19  7846     History   Chief Complaint Chief Complaint  Patient presents with  . Eye Pain    HPI Tamara Cruz is a 23 y.o. female.     HPI 23 year old African-American female with no pertinent past medical history presents to the emergency department today for evaluation of left eye irritation.  Onset 3 days ago.  Reports some redness, itching and drainage.  She states that she wakes up in the morning with her eyes matted.  She pulls mucus out of her eyes.  Patient denies any known trauma to the eye.  Denies any fever, chills.  She denies any significant pain to the eye.  Denies any upper respiratory tract infection symptoms.  Has tried no medications for symptoms prior to arrival.  Denies any vision changes peer does not wear contacts or glasses. Past Medical History:  Diagnosis Date  . Menstrual cramps     There are no active problems to display for this patient.   History reviewed. No pertinent surgical history.   OB History   No obstetric history on file.      Home Medications    Prior to Admission medications   Medication Sig Start Date End Date Taking? Authorizing Provider  medroxyPROGESTERone (DEPO-PROVERA) 150 MG/ML injection Inject 150 mg into the muscle every 3 (three) months.    [provider]  methocarbamol (ROBAXIN) 500 MG tablet Take 2 tablets (1,000 mg total) by mouth 4 (four) times daily. 02/17/19   Renne Crigler, PA-C  naproxen (NAPROSYN) 500 MG tablet Take 1 tablet (500 mg total) by mouth 2 (two) times daily. 02/17/19   Renne Crigler, PA-C    Family History History reviewed. No pertinent family history.  Social History Social History   Tobacco Use  . Smoking status: Current Every Day Smoker    Types: Cigars  . Smokeless tobacco: Never Used  Substance Use Topics  . Alcohol use: No    Comment: occ  . Drug use: Yes    Types:  Marijuana    Comment: last smoked yesterday     Allergies   Pineapple   Review of Systems Review of Systems  Constitutional: Negative for chills and fever.  HENT: Negative for congestion, rhinorrhea and sore throat.   Eyes: Positive for discharge, redness and itching. Negative for photophobia, pain and visual disturbance.  Skin: Negative for color change.  Neurological: Negative for headaches.  Psychiatric/Behavioral: Negative for confusion.     Physical Exam Updated Vital Signs Ht 5\' 1"  (1.549 m)   Wt 53.5 kg   BMI 22.30 kg/m   Physical Exam Vitals signs and nursing note reviewed.  Constitutional:      General: She is not in acute distress.    Appearance: She is well-developed. She is not ill-appearing or toxic-appearing.  HENT:     Head: Normocephalic and atraumatic.  Eyes:     General: Lids are normal. Lids are everted, no foreign bodies appreciated. No visual field deficit or scleral icterus.       Right eye: No discharge.        Left eye: Discharge present.No foreign body or hordeolum.     Extraocular Movements:     Left eye: Normal extraocular motion.     Conjunctiva/sclera:     Left eye: Left conjunctiva is injected. No chemosis, exudate or hemorrhage.    Pupils: Pupils are equal, round, and reactive to  light.     Left eye: No corneal abrasion or fluorescein uptake.  Neck:     Musculoskeletal: Normal range of motion.  Pulmonary:     Effort: No respiratory distress.  Musculoskeletal: Normal range of motion.  Skin:    Coloration: Skin is not pale.  Neurological:     Mental Status: She is alert.  Psychiatric:        Behavior: Behavior normal.        Thought Content: Thought content normal.        Judgment: Judgment normal.      ED Treatments / Results  Labs (all labs ordered are listed, but only abnormal results are displayed) Labs Reviewed - No data to display  EKG None  Radiology No results found.  Procedures Procedures (including  critical care time)  Medications Ordered in ED Medications  fluorescein ophthalmic strip 1 strip (has no administration in time range)  tetracaine (PONTOCAINE) 0.5 % ophthalmic solution 1 drop (has no administration in time range)     Initial Impression / Assessment and Plan / ED Course  I have reviewed the triage vital signs and the nursing notes.  Pertinent labs & imaging results that were available during my care of the patient were reviewed by me and considered in my medical decision making (see chart for details).         Patient presentation consistent with conjunctivitis.  No purulent discharge, corneal abrasions, entrapment, consensual photophobia, or dendritic staining with fluorescein study.  Presentation non-concerning for iritis, corneal abrasions, or HSV.  Concern for possible bacterial conjunctivitis.  Will place on erythromycin ointment.  Patient does not wear contacts or glasses.  Personal hygiene and frequent handwashing discussed.  Patient advised to followup with ophthalmologist if symptoms persist or worsen in any way including vision change or purulent discharge.  Patient verbalizes understanding and is agreeable with discharge.   Pt is hemodynamically stable, in NAD, & able to ambulate in the ED. Evaluation does not show pathology that would require ongoing emergent intervention or inpatient treatment. I explained the diagnosis to the patient. Pain has been managed & has no complaints prior to dc. Pt is comfortable with above plan and is stable for discharge at this time. All questions were answered prior to disposition. Strict return precautions for f/u to the ED were discussed. Encouraged follow up with PCP.    Final Clinical Impressions(s) / ED Diagnoses   Final diagnoses:  Acute conjunctivitis of left eye, unspecified acute conjunctivitis type    ED Discharge Orders         Ordered    erythromycin ophthalmic ointment     04/05/19 1130            Doristine Devoid, PA-C 04/05/19 1131    Wyvonnia Dusky, MD 04/05/19 1806

## 2019-04-05 NOTE — Discharge Instructions (Signed)
Concern for possible conjunctivitis of the eye.  Have given you antibiotics to use at home.  Please follow-up with an ophthalmologist.  If you develop any vision changes, worsening pain, or any new symptoms return to the ER.

## 2019-04-05 NOTE — ED Triage Notes (Signed)
Pt reports left eye irritation x 2 days. Reports drainage x 2 days. None noted at this time, no redness noted.

## 2020-03-13 IMAGING — CR DG CHEST 2V
2 series · 2 of 2 positions shown · non-contrast
Comparison: 07/18/2015

CLINICAL DATA: Left upper chest pain

EXAM:
CHEST - 2 VIEW

[w chest pa]
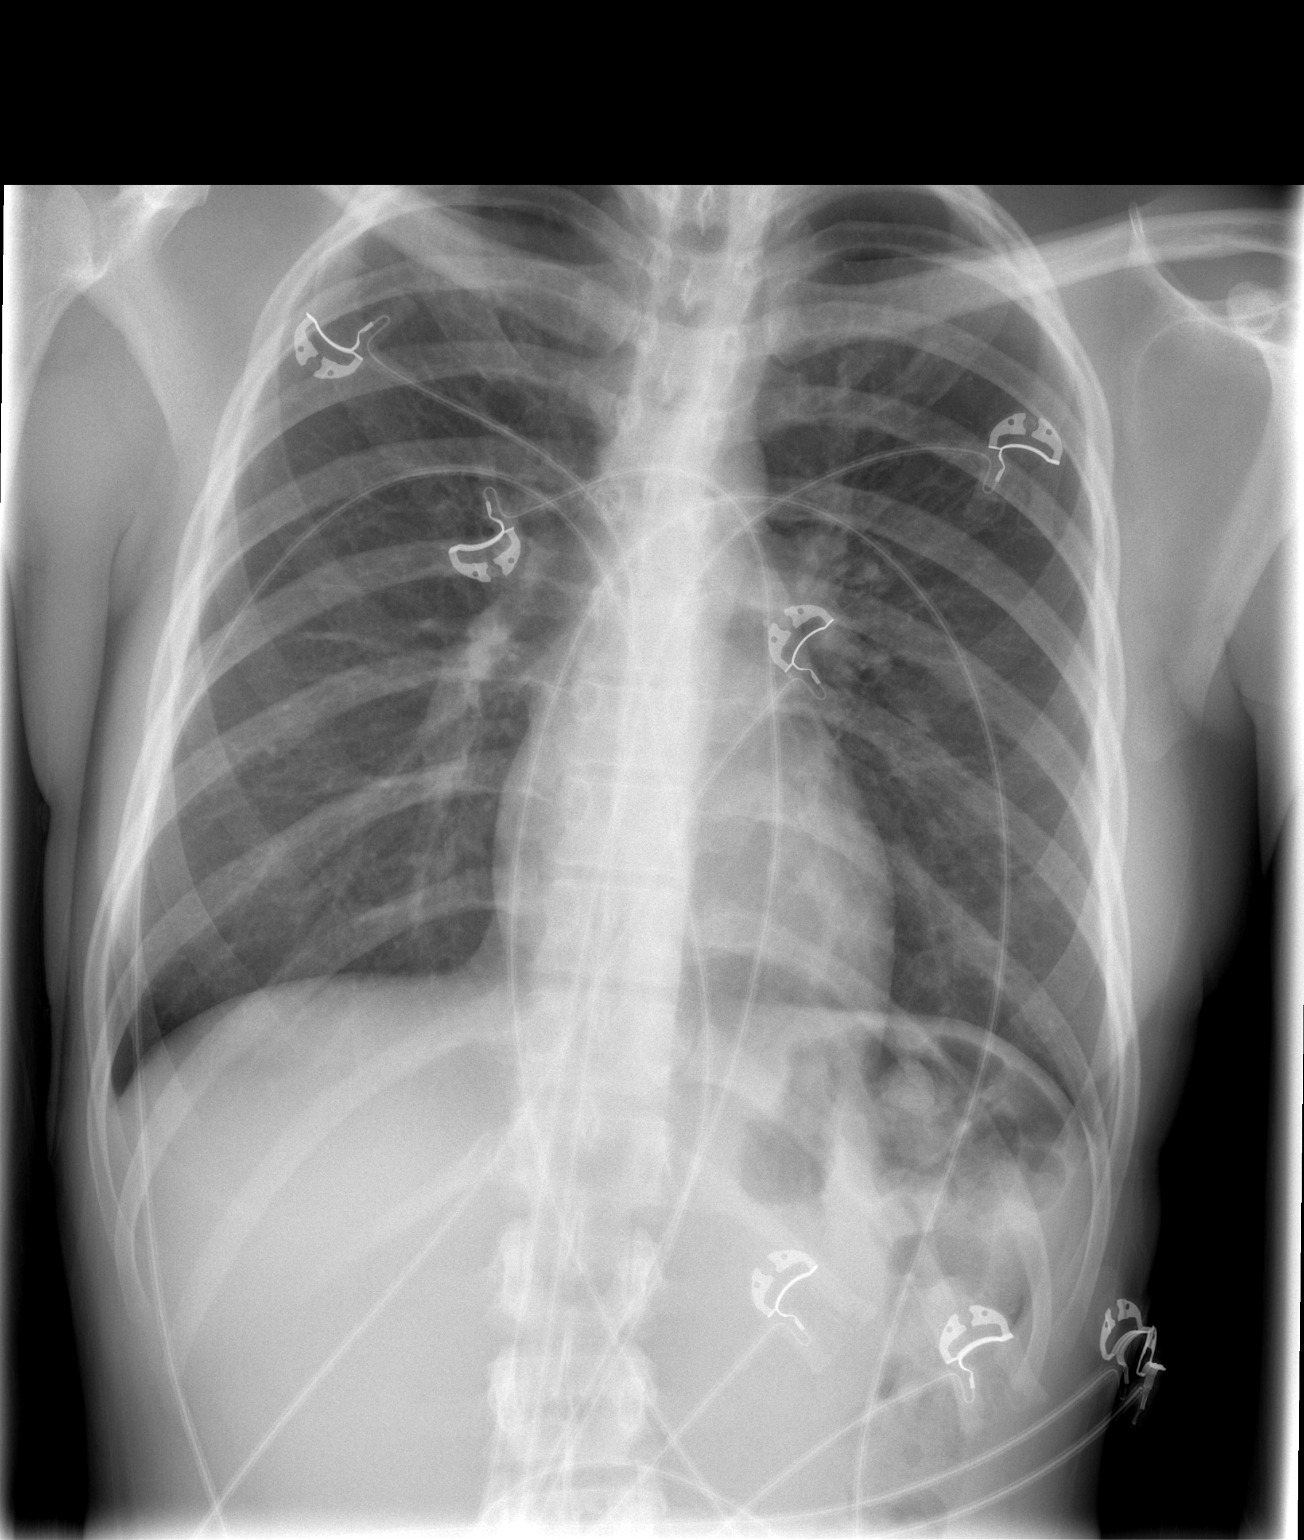

[w chest lat]
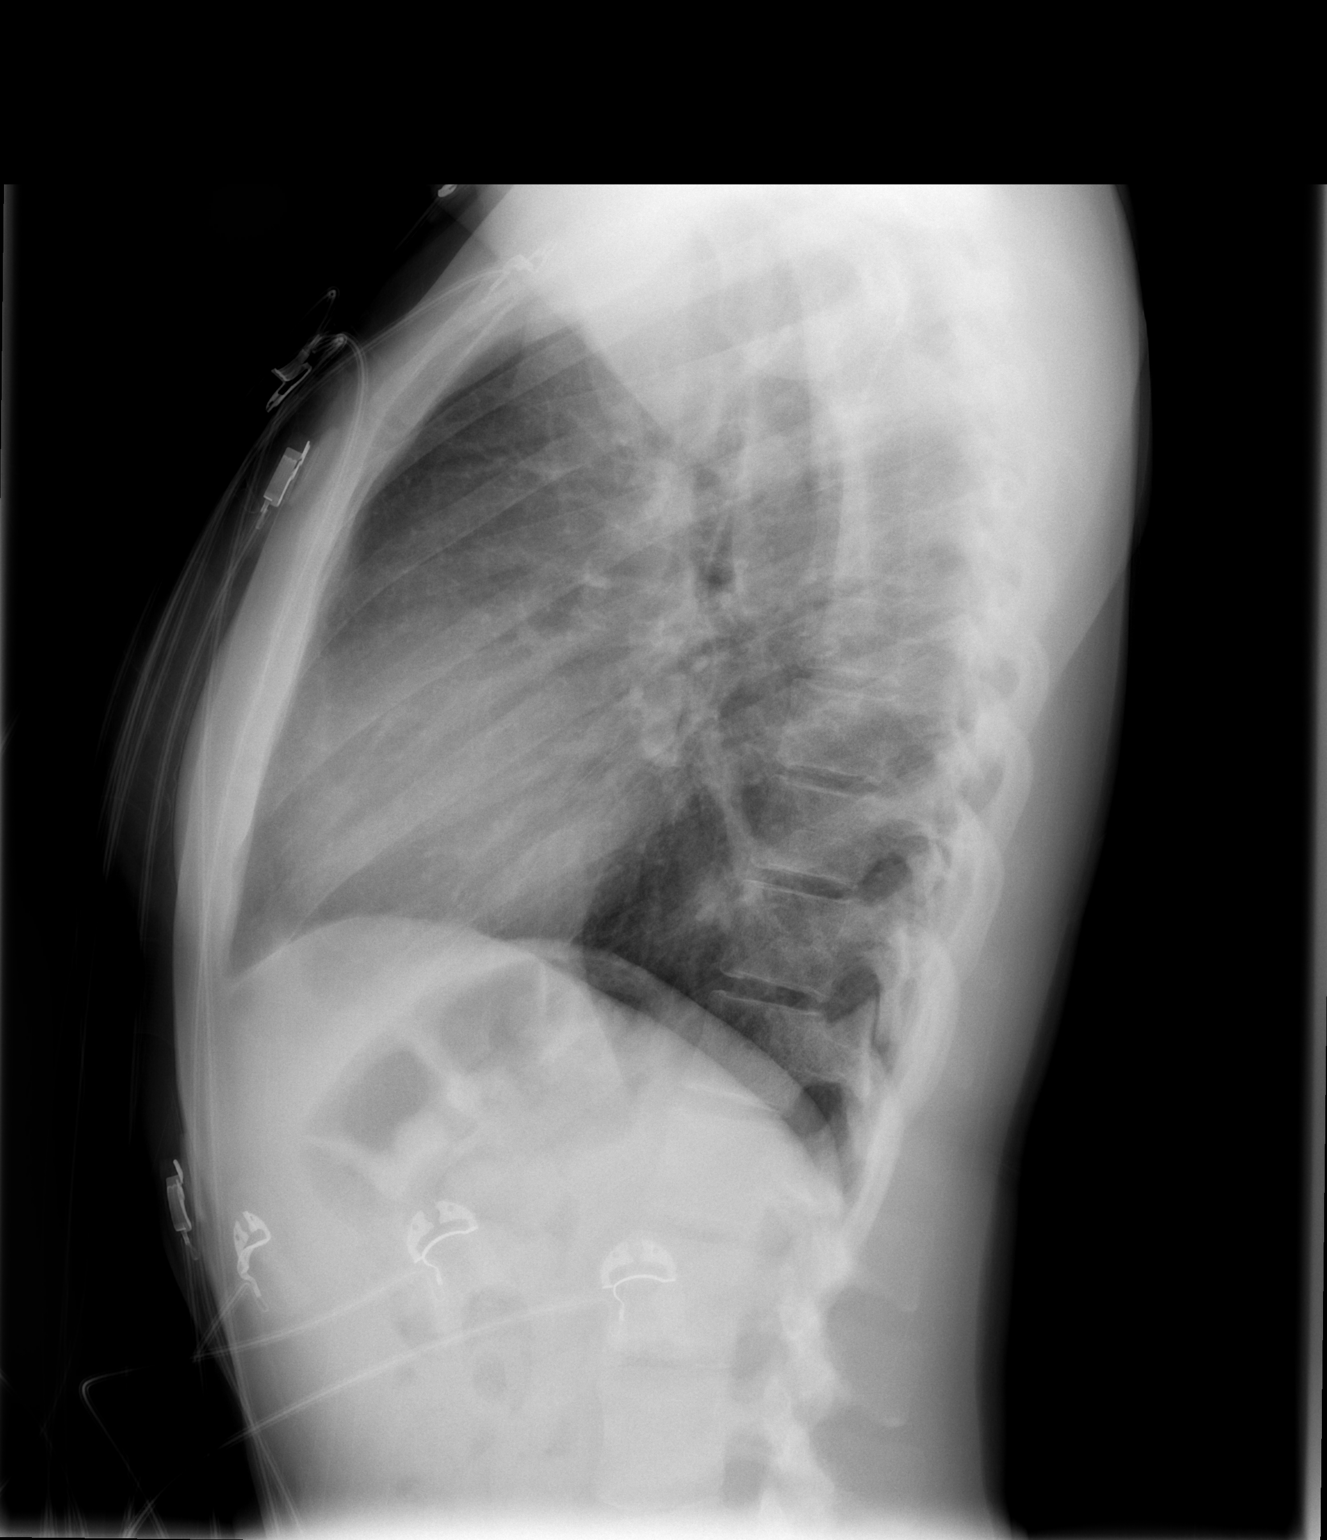

[2 of 2 positions shown; findings below may reference images not displayed]

FINDINGS: Normal heart size and mediastinal contours. No acute infiltrate or
edema. No effusion or pneumothorax. No acute osseous findings.
IMPRESSION: Negative chest.

## 2022-10-04 ENCOUNTER — Encounter (HOSPITAL_BASED_OUTPATIENT_CLINIC_OR_DEPARTMENT_OTHER): Payer: Self-pay | Admitting: Emergency Medicine

## 2022-10-04 ENCOUNTER — Emergency Department (HOSPITAL_BASED_OUTPATIENT_CLINIC_OR_DEPARTMENT_OTHER)
Admission: EM | Admit: 2022-10-04 | Discharge: 2022-10-04 | Disposition: A | Discharge status: Home / Self Care | Payer: Medicaid Other | Attending: Emergency Medicine | Admitting: Emergency Medicine

## 2022-10-04 ENCOUNTER — Other Ambulatory Visit: Payer: Self-pay

## 2022-10-04 DIAGNOSIS — M546 Pain in thoracic spine: Secondary | ICD-10-CM | POA: Diagnosis not present

## 2022-10-04 DIAGNOSIS — R519 Headache, unspecified: Secondary | ICD-10-CM | POA: Diagnosis present

## 2022-10-04 DIAGNOSIS — Y92410 Unspecified street and highway as the place of occurrence of the external cause: Secondary | ICD-10-CM | POA: Insufficient documentation

## 2022-10-04 DIAGNOSIS — M542 Cervicalgia: Secondary | ICD-10-CM | POA: Insufficient documentation

## 2022-10-04 MED ORDER — MELOXICAM 15 MG PO TABS: 15.0000 mg | ORAL_TABLET | Freq: Every day | ORAL | 0 refills | Status: DC | PRN

## 2022-10-04 MED ORDER — CYCLOBENZAPRINE HCL 10 MG PO TABS: 10.0000 mg | ORAL_TABLET | Freq: Two times a day (BID) | ORAL | 0 refills | Status: AC | PRN

## 2022-10-04 NOTE — Discharge Instructions (Signed)
Your visit to the emergency department today was overall reassuring.  As discussed, I suspect your back/neck pain is likely muscular in nature given that it was not painful yesterday but began to be painful last night into this morning.  Regarding headache, I suspect you have a concussive type episode.  Will recommend anti-inflammatories in the form of Mobic as well as taking Flexeril as needed for muscular spasm.  Flexeril can cause drowsiness so please do not drive while taking medication.  Recommend follow-up with primary care for reassessment of your symptoms.  Please not hesitate to return to emergency department if the worrisome signs and symptoms we discussed become apparent.
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
## 2022-10-06 ENCOUNTER — Encounter (HOSPITAL_BASED_OUTPATIENT_CLINIC_OR_DEPARTMENT_OTHER): Payer: Self-pay | Admitting: Emergency Medicine

## 2022-10-06 ENCOUNTER — Emergency Department (HOSPITAL_BASED_OUTPATIENT_CLINIC_OR_DEPARTMENT_OTHER)
Admission: EM | Admit: 2022-10-06 | Discharge: 2022-10-06 | Disposition: A | Discharge status: Home / Self Care | Payer: Medicaid Other | Attending: Emergency Medicine | Admitting: Emergency Medicine

## 2022-10-06 ENCOUNTER — Other Ambulatory Visit: Payer: Self-pay

## 2022-10-06 ENCOUNTER — Emergency Department (HOSPITAL_BASED_OUTPATIENT_CLINIC_OR_DEPARTMENT_OTHER): Payer: Medicaid Other

## 2022-10-06 DIAGNOSIS — S0990XA Unspecified injury of head, initial encounter: Secondary | ICD-10-CM | POA: Diagnosis present

## 2022-10-06 DIAGNOSIS — M546 Pain in thoracic spine: Secondary | ICD-10-CM | POA: Diagnosis not present

## 2022-10-06 DIAGNOSIS — Y92410 Unspecified street and highway as the place of occurrence of the external cause: Secondary | ICD-10-CM | POA: Insufficient documentation

## 2022-10-06 DIAGNOSIS — R519 Headache, unspecified: Secondary | ICD-10-CM

## 2022-10-06 MED ORDER — SUMATRIPTAN SUCCINATE 50 MG PO TABS: 50.0000 mg | ORAL_TABLET | ORAL | 0 refills | Status: AC | PRN

## 2022-10-06 MED ORDER — KETOROLAC TROMETHAMINE 30 MG/ML IJ SOLN
30.0000 mg | Freq: Once | INTRAMUSCULAR | Status: AC
  Administered 2022-10-06: 30 mg via INTRAMUSCULAR
  Filled 2022-10-06: qty 1

## 2022-10-06 MED ORDER — KETOROLAC TROMETHAMINE 10 MG PO TABS: 10.0000 mg | ORAL_TABLET | Freq: Four times a day (QID) | ORAL | 0 refills | Status: AC | PRN
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
## 2022-10-06 NOTE — Discharge Instructions (Addendum)
As discussed, imaging studies were negative for any acute fracture/dislocation of the bones in your back.  CT imaging of your head was without signs of bleeding, stroke, mass or other abnormality.  Will recommend additional medication at home for treatment of headache in the form of Imitrex; do not take more than 2 doses within 24 hours.  Will also send in prescription of medicine we gave you an injection of today called Toradol; it is in the same family as meloxicam as well as ibuprofen/Aleve so please do not take all of these medicines together..  Recommend follow-up with primary care for reassessment of your symptoms.  Please not hesitate to return to emergency department for worrisome signs or symptoms we discussed become apparent.
